# Patient Record
Sex: Female | Born: 1965 | Race: Black or African American | Hispanic: No | Marital: Single | Smoking: Former smoker
Health system: Southern US, Community
[De-identification: ages and names within clinical notes are randomized; demographics above are authoritative.]

## PROBLEM LIST (undated history)

## (undated) DIAGNOSIS — F209 Schizophrenia, unspecified: Secondary | ICD-10-CM

## (undated) DIAGNOSIS — B20 Human immunodeficiency virus [HIV] disease: Secondary | ICD-10-CM

## (undated) DIAGNOSIS — Z21 Asymptomatic human immunodeficiency virus [HIV] infection status: Secondary | ICD-10-CM

## (undated) DIAGNOSIS — F411 Generalized anxiety disorder: Secondary | ICD-10-CM

## (undated) DIAGNOSIS — M109 Gout, unspecified: Secondary | ICD-10-CM

## (undated) DIAGNOSIS — K219 Gastro-esophageal reflux disease without esophagitis: Secondary | ICD-10-CM

## (undated) DIAGNOSIS — D509 Iron deficiency anemia, unspecified: Secondary | ICD-10-CM

## (undated) HISTORY — DX: Gastro-esophageal reflux disease without esophagitis: K21.9

## (undated) HISTORY — PX: LUNG SURGERY: SHX703

## (undated) HISTORY — DX: Asymptomatic human immunodeficiency virus (hiv) infection status: Z21

## (undated) HISTORY — DX: Schizophrenia, unspecified: F20.9

## (undated) HISTORY — DX: Human immunodeficiency virus (HIV) disease: B20

## (undated) HISTORY — DX: Gout, unspecified: M10.9

## (undated) HISTORY — DX: Generalized anxiety disorder: F41.1

## (undated) HISTORY — DX: Iron deficiency anemia, unspecified: D50.9

---

## 2013-02-21 HISTORY — PX: OTHER SURGICAL HISTORY: SHX169

## 2020-03-19 ENCOUNTER — Other Ambulatory Visit (HOSPITAL_COMMUNITY): Payer: Self-pay | Admitting: *Deleted

## 2020-03-19 ENCOUNTER — Telehealth (INDEPENDENT_AMBULATORY_CARE_PROVIDER_SITE_OTHER): Payer: No Payment, Other | Admitting: Psychiatry

## 2020-03-19 ENCOUNTER — Telehealth (HOSPITAL_COMMUNITY): Payer: Self-pay | Admitting: *Deleted

## 2020-03-19 ENCOUNTER — Encounter (HOSPITAL_COMMUNITY): Payer: Self-pay | Admitting: Psychiatry

## 2020-03-19 ENCOUNTER — Other Ambulatory Visit: Payer: Self-pay

## 2020-03-19 DIAGNOSIS — F419 Anxiety disorder, unspecified: Secondary | ICD-10-CM | POA: Diagnosis not present

## 2020-03-19 DIAGNOSIS — F200 Paranoid schizophrenia: Secondary | ICD-10-CM | POA: Diagnosis not present

## 2020-03-19 MED ORDER — DOXEPIN HCL 50 MG PO CAPS: 50.0000 mg | ORAL_CAPSULE | Freq: Every day | ORAL | 1 refills | Status: DC

## 2020-03-19 MED ORDER — CLONAZEPAM 0.5 MG PO TABS: 0.5000 mg | ORAL_TABLET | Freq: Every day | ORAL | 1 refills | Status: DC

## 2020-03-19 MED ORDER — RISPERIDONE 1 MG PO TABS: 1.0000 mg | ORAL_TABLET | Freq: Every day | ORAL | 1 refills | Status: DC

## 2020-03-19 MED FILL — DOXEPIN HCL 50 MG CAPS: 50 | 30 days supply | Qty: 30 | Fill #0

## 2020-03-19 MED FILL — risperiDONE 1 MG TABS: 1 | 30 days supply | Qty: 30 | Fill #0

## 2020-03-19 NOTE — Telephone Encounter (Signed)
Call from patients case manager stating medicines have to be moved to [facility] from [place] because she hasnt had her medicaid moved to [place] yet and [place] cant do PAP for her. Called Mitch back to inform Klonopin cant be filled at community they dont do controlled drugs and It isnt a pap med and to call them and ask them to move it from [place]. He said he did call them but they referred him back to this clinic. Agreed to move her two rx to community and he will have to work out how to afford the Klonopin at [place].

## 2020-03-19 NOTE — Progress Notes (Signed)
Psychiatric Initial Adult Assessment   Virtual Visit via Video Note  I connected with Anne York on 03/19/20 at  1:00 PM EST by a video enabled telemedicine application and verified that I am speaking with the correct person using two identifiers.  Location: Patient: Home Provider: Clinic   I discussed the limitations of evaluation and management by telemedicine and the availability of in person appointments. The patient expressed understanding and agreed to proceed.  I provided 32 minutes of non-face-to-face time during this encounter.     Patient Identification: Anne York MRN:  [id number] Date of Evaluation:  03/19/2020   Referral Source: Case Worker  Chief Complaint:   " I have schizophrenia, anxiety and depression."  Visit Diagnosis:    ICD-10-CM   1. Schizophrenia, paranoid (HCC)  F20.0   2. Anxiety  F41.9     History of Present Illness: This is a 55 year old female with history of intracranial, anxiety, depression, HIV positive status who recently moved from [place] now seen for evaluation. Patient stated that she moved here from [place] in December 2021.  She informed that she moved here because her son lives in [place] and when he had come down to visit her he was very concerned about her and advised her to move up to [place] with him. She reported that when she made the decision to move here somebody had given her the contact information for a caseworker Mr. Marthann Schiller.  She stated that Mr. Marthann Schiller is the one who helped her make this appointment with the writer today. She stated that she has been taking Seroquel 300 mg at bedtime, trazodone 100 mg at bedtime, clonazepam for past many years.  She stated that she thinks she has been on the same regimen since around 2010. She stated that lately she has been dealing with a lot of auditory visual hallucinations, poor sleep and marked anxiety.  She stated that she stays at home and does not go anywhere.  She has no  interaction with anyone.  She informed that she contracted Covid in March 2021 and since then has not been able to regain her senses of smell and taste and as result her appetite is very poor.  She stated that initially her medicine Seroquel used to help her with her hallucinations and her sleep however as time has progressed she does not think the Seroquel helps her with her voices or sleep or mood anymore.  She also stated that trazodone does not help her either and she may take both the Seroquel and trazodone together at bedtime but still struggles with falling asleep and staying asleep. She stated that many years ago she used to take Seroquel 25 mg 3 times daily in the daytime along with the bedtime dose and that used to work better for her.  She also stated that she used to take clonazepam 0.5 mg 3 times a day and that helped her immensely.  However once the pandemic started she had a very hard time getting appointments with her psychiatrist.  She stated that in 2020 once the pandemic started it seems like everything around her fell apart and all her doctors appointments became very difficult to get. She stated that she eventually was able to talk to her primary care provider and they were agreeable to continue filling her prescriptions for Seroquel 300 mg at bedtime and trazodone 100 mg at bedtime along with clonazepam 0.5 mg.  She kept insisting that she was taking clonazepam 0.5 mg 3  times a day and that is what she really needs to be back on. Patient spent a considerable amount of time discussing about being prescribed a higher dose of clonazepam.  Writer checked the PDMP Rx aware of [place] state and noticed that she has been on clonazepam 0.5 mg daily for the last 2 years prescribed by a provider in [place].  Writer explained to the patient clearly that she will not be prescribed clonazepam 0.5 mg 3 times a day and all the writer can prescribe for her is clonazepam 0.5 mg daily as she was being  prescribed by her last provider in [place].  Patient agreed to that. She then stated that she does not think the Seroquel and trazodone are effective anymore and she asked if she could be switched to something else. Writer asked if she has been on any other psychotropic medications in the past, she replied she does not think she has.  She also stated that she had inconsistent psychiatry providers and she really hopes that she can continue seeing the same psychiatry provider in the future. She also Clinical research associate if Retail banker will be the one seeing her again next time.  Writer provided her with reassurance regarding that and recommended several different antipsychotics that can help with hallucinations.  Writer informed her about the option of trial of risperidone or Abilify.  Patient stated that she has heard of risperidone.  Writer advised trial of risperidone to see if that would help her with the hallucinations, paranoid delusions and also with poor sleep.  Writer also offered her doxepin to help with poor sleep as needed.  Writer asked if she needs a referral for the infectious disease clinic, patient stated that she does not think she does because her caseworker Mr. Clovis Riley has already supposed to be working on that.  She still has her HIV medication left and she is hoping to see someone in the near future. Writer double checked the EMR and could not see any other appointment scheduled for the patient.   Past Psychiatric History: Schizophrenia, anxiety, depression  Previous Psychotropic Medications: Yes   Substance Abuse History in the last 12 months:  No.  Consequences of Substance Abuse: NA  Past Medical History: HIV Infection  Family Psychiatric History: denied  Family History: No family history on file.  Social History:   Social History   Socioeconomic History  . Marital status: Unknown    Spouse name: Not on file  . Number of children: Not on file  . Years of education: Not on  file  . Highest education level: Not on file  Occupational History  . Not on file  Tobacco Use  . Smoking status: Not on file  . Smokeless tobacco: Not on file  Substance and Sexual Activity  . Alcohol use: Not on file  . Drug use: Not on file  . Sexual activity: Not on file  Other Topics Concern  . Not on file  Social History Narrative  . Not on file   Social Determinants of Health   Financial Resource Strain: Not on file  Food Insecurity: Not on file  Transportation Needs: Not on file  Physical Activity: Not on file  Stress: Not on file  Social Connections: Not on file    Additional Social History: Lives by herself, unemployed. Used to receive disability in [place]. Son lives in [place]  Allergies:  Not on File  Metabolic Disorder Labs: No results found for: HGBA1C, MPG No results found for: PROLACTIN  No results found for: CHOL, TRIG, HDL, CHOLHDL, VLDL, LDLCALC No results found for: TSH  Therapeutic Level Labs: No results found for: LITHIUM No results found for: CBMZ No results found for: VALPROATE  Current Medications: No current outpatient medications on file.   No current facility-administered medications for this visit.      Psychiatric Specialty Exam: Review of Systems  There were no vitals taken for this visit.There is no height or weight on file to calculate BMI.  General Appearance: Fairly Groomed  Eye Contact:  Good  Speech:  Clear and Coherent and Normal Rate  Volume:  Normal  Mood:  Depressed  Affect:  Tearful  Thought Process:  Goal Directed and Descriptions of Associations: Intact  Orientation:  Full (Time, Place, and Person)  Thought Content:  Logical and Hallucinations: Auditory  Suicidal Thoughts:  No  Homicidal Thoughts:  No  Memory:  Immediate;   Good Recent;   Fair  Judgement:  Fair  Insight:  Fair  Psychomotor Activity:  Normal  Concentration:  Concentration: Good and Attention Span: Good  Recall:  Good  Fund of Knowledge:Good   Language: Good  Akathisia:  Negative  Handed:  Right  AIMS (if indicated):  not done due to telemed visit  Assets:  Communication Skills Desire for Improvement Housing  ADL's:  Intact  Cognition: WNL  Sleep:  Poor     Assessment and Plan: Patient patient's history of psychiatric illness and her poor control of symptoms with her current regimen of Seroquel, trazodone will switch to different medications as per patient's preference.  Risperidone was recommended to address the hallucinations or paranoid delusions.  The group it was recommended to help with sleep.  Patient will was very insistent on being prescribed clonazepam 0.5 mg 3 times a day however after writer checked the PDMP and noted that she was only being prescribed 0.5 mg daily by her previous provider for the last 2 years writer informed the patient that she will not be given a higher dose than that.  Patient somewhat reluctantly agreed. Potential side effects of medication and risks vs benefits of treatment vs non-treatment were explained and discussed. All questions were answered. Patient is HIV positive and when the writer offered to refer her to the infectious disease clinic patient stated that she does not think she needs the referral because her caseworker is supposed to be working on that.  Writer checked the EMR and did not find any other appointment scheduled for the patient by any other specialty.    1. Schizophrenia, paranoid (HCC)  -Start risperiDONE (RISPERDAL) 1 MG tablet; Take 1 tablet (1 mg total) by mouth at bedtime.  Dispense: 30 tablet; Refill: 1 -Start doxepin (SINEQUAN) 50 MG capsule; Take 1 capsule (50 mg total) by mouth at bedtime.  Dispense: 30 capsule; Refill: 1 -Patient was instructed to discontinue Seroquel and trazodone when she gets these prescriptions filled.  2. Anxiety  - risperiDONE (RISPERDAL) 1 MG tablet; Take 1 tablet (1 mg total) by mouth at bedtime.  Dispense: 30 tablet; Refill:  1 -Continue clonazePAM (KLONOPIN) 0.5 MG tablet; Take 1 tablet (0.5 mg total) by mouth daily.  Dispense: 30 tablet; Refill: 1    Zena Amos, MD 1/27/20221:33 PM

## 2020-03-20 ENCOUNTER — Telehealth: Payer: Self-pay

## 2020-03-20 NOTE — Telephone Encounter (Signed)
[facility] Patient Product/process development scientist completed.    The patient is uninsured and will need patient assistance for medication.  We can complete the application and will need to meet with the patient for signatures and income documentation.   We gave patient a 1 month supply of Symtuza (sample) until her medicaid is active.  Clearance Coots, CPhT Specialty Pharmacy Patient [facility] for Infectious Disease Phone: [phone] Fax:  [phone]

## 2020-03-31 ENCOUNTER — Other Ambulatory Visit: Payer: Self-pay

## 2020-03-31 ENCOUNTER — Ambulatory Visit (INDEPENDENT_AMBULATORY_CARE_PROVIDER_SITE_OTHER): Payer: Medicaid Other | Admitting: Infectious Diseases

## 2020-03-31 ENCOUNTER — Encounter: Payer: Self-pay | Admitting: Infectious Diseases

## 2020-03-31 DIAGNOSIS — R0602 Shortness of breath: Secondary | ICD-10-CM

## 2020-03-31 DIAGNOSIS — R21 Rash and other nonspecific skin eruption: Secondary | ICD-10-CM

## 2020-03-31 DIAGNOSIS — B20 Human immunodeficiency virus [HIV] disease: Secondary | ICD-10-CM | POA: Insufficient documentation

## 2020-03-31 DIAGNOSIS — Z21 Asymptomatic human immunodeficiency virus [HIV] infection status: Secondary | ICD-10-CM | POA: Insufficient documentation

## 2020-03-31 NOTE — Patient Instructions (Addendum)
Please continue the Symtuza once a day.   I would like to get a chest x ray on your and lung function tests when we have access to that with your Medicaid.   Please stop by the lab on your way out.   Compression Stockings may be helpful - they sell these on [employer] and have some cute patterns!   Plan to come back in 6 weeks.    For primary care services, please call one of the following clinics for a new patient appointment:   Each clinic have different programs to support your health care needs when you don't have access to health insurance.   1. Internal Medicine Clinic - ground floor of [facility]. [phone]  2. [facility] - [address].  [phone]  3. Patient Care Center  - Near [facility] in [place]  [phone]

## 2020-03-31 NOTE — Progress Notes (Signed)
Name: Anne York  DOB: 06/30/1965 MRN: [id number] PCP: Patient, No Pcp Per     Brief Narrative:  Anne York is a 55 y.o. female with HIV dx 2005 in [place] at [facility].  CD4 nadir < 200  HIV Risk: heterosexual  History of OIs: none Intake Labs 2022: Hep B sAg (-), sAb (+), cAb (-); Hep A (+), Hep C (-) Quantiferon (-) HLA B*5701 (-) G6PD: ()   Previous Regimens: . 2013 - Prezista + Norvir + Truvada  . Symtuza   Genotypes: . 2014 - K103N noted   Subjective:   Chief Complaint  Patient presents with  . Follow-up     HPI: Anne York is here to transfer care for HIV disease.  LOV with [facility] in [place] 01/220. She is taking Symtuza once daily and last CD4 534 with undetectable viral load. Normal liver and kidney function. Pap smear done 11-2019 with negative HPV and normal cytology.   Had COVID recently and since that time she has had a hard time with breathing since then. Uses ProAir MDI recently. Had [place] in March in 2021.  Has felt like she really struggled with her breathing since.   She has a purple/flaking rash on  The bottoms of her feet hurt her and noticed Cathell bumps on the feet. She has tried steroids in the past. Saw a dermatologist but has not had a biopsy. Feels that is started on her right foot then left now it is on her lower back. Cannot quite say what is the newest lesion but it has been ongoing since May 2021. Does have associated swelling of the feet.    Review of Systems  All other systems reviewed and are negative.    Past Medical History:  Diagnosis Date  . GAD (generalized anxiety disorder)   . GERD (gastroesophageal reflux disease)   . Gout   . HIV (human immunodeficiency virus infection) ([place]) Dx 2005  . IDA (iron deficiency anemia)   . Schizophrenia [facility])     Outpatient Medications Prior to Visit  Medication Sig Dispense Refill  . albuterol (VENTOLIN HFA) 108 (90 Base) MCG/ACT inhaler Inhale 1 puff into the lungs  every 4 (four) hours as needed for wheezing or shortness of breath.    . allopurinol (ZYLOPRIM) 100 MG tablet Take 100 mg by mouth daily.    . clonazePAM (KLONOPIN) 0.5 MG tablet Take 1 tablet (0.5 mg total) by mouth daily. 30 tablet 1  . Darunavir-Cobicisctat-Emtricitabine-Tenofovir Alafenamide (SYMTUZA) 800-150-200-10 MG TABS Take 1 tablet by mouth daily with breakfast.    . doxepin (SINEQUAN) 50 MG capsule Take 1 capsule (50 mg total) by mouth at bedtime. 30 capsule 1  . QUEtiapine (SEROQUEL) 300 MG tablet Take 300 mg by mouth at bedtime.    . risperiDONE (RISPERDAL) 1 MG tablet Take 1 tablet (1 mg total) by mouth at bedtime. 30 tablet 1   No facility-administered medications prior to visit.     No Known Allergies  Social History   Tobacco Use  . Smoking status: Light Tobacco Smoker    Types: Cigarettes  . Smokeless tobacco: Never Used  Substance Use Topics  . Alcohol use: Never  . Drug use: Yes    Types: Marijuana    Family History  Problem Relation Age of Onset  . Diabetes Mother   . Hypertension Mother   . Diabetes Brother   . Kidney failure Other     Social History   Substance and Sexual Activity  Sexual Activity Not Currently     Objective:   Vitals:   03/31/20 1506  BP: (!) 89/62  Pulse: (!) 109  Weight: 174 lb (78.9 kg)   There is no height or weight on file to calculate BMI.  Physical Exam Vitals reviewed.  Constitutional:      Appearance: She is well-developed.     Comments: Seated comfortably in chair.   HENT:     Mouth/Throat:     Mouth: No oral lesions.     Dentition: Normal dentition. No dental abscesses.     Pharynx: No oropharyngeal exudate.  Cardiovascular:     Rate and Rhythm: Normal rate and regular rhythm.     Heart sounds: Normal heart sounds.  Pulmonary:     Effort: Pulmonary effort is normal.     Breath sounds: Normal breath sounds.  Abdominal:     General: There is no distension.     Palpations: Abdomen is soft.      Tenderness: There is no abdominal tenderness.  Musculoskeletal:     Comments: Gait is limping with ankle/foot pain  Lymphadenopathy:     Cervical: No cervical adenopathy.  Skin:    General: Skin is warm and dry.     Findings: No rash.     Comments: Flaking purple rash to her feet and up to lower back. Non-raised, non erythematous.   Neurological:     Mental Status: She is alert and oriented to person, place, and time.  Psychiatric:        Judgment: Judgment normal.     Comments: In good spirits today and engaged in care discussion     Lab Results Lab Results  Component Value Date   WBC 4.0 03/31/2020   HGB 12.8 03/31/2020   HCT 37.9 03/31/2020   MCV 98.4 03/31/2020   PLT 199 03/31/2020    Lab Results  Component Value Date   CREATININE 0.84 03/31/2020   BUN 25 03/31/2020   NA 140 03/31/2020   K 4.4 03/31/2020   CL 109 03/31/2020   CO2 23 03/31/2020    Lab Results  Component Value Date   ALT 15 03/31/2020   AST 26 03/31/2020   BILITOT 0.3 03/31/2020    No results found for: CHOL, HDL, LDLCALC, LDLDIRECT, TRIG, CHOLHDL HIV 1 RNA Quant (Copies/mL)  Date Value  03/31/2020 <20   CD4 T Cell Abs (/uL)  Date Value  03/31/2020 457     Assessment & Plan:   Problem List Items Addressed This Visit      Unprioritized   Rash and nonspecific skin eruption    Chronic problem that has not been diagnosed. Given rash, pulmonary symptoms and arthralgias I worry she may have sarcoid.   Discussed referral to dermatology - she would like to wait until her medicaid is on for access.       HIV (human immunodeficiency virus infection) ([place])    New patient here to establish for HIV care.  On symtyza once daily, well controlled with VL < 20 and CD4 457.   I discussed with Kortney Potvin treatment options/side effects, benefits of treatment and long-term outcomes. I discussed how HIV is transmitted and the process of untreated HIV including increased risk for opportunistic  infections, cancer, dementia and renal failure. Patient was counseled on routine HIV care including medication adherence, blood monitoring, necessary vaccines and follow up visits. Counseled regarding safe sex practices including: condom use, partner disclosure, limiting partners.  She is awaiting medicaid to be  turned on - will have her back in 1 month to help arrange primary care and specialty care referrals.   General introduction to our clinic and integrated services.  Dental referral placed today for [facility]. Information to schedule appointment completed today.   I spent greater than 45 minutes with the patient today. Greater than 50% of the time spent face-to-face counseling and coordination of care re: HIV and health maintenance.        Relevant Orders   HIV-1 RNA quant-no reflex-bld (Completed)   T-helper cell (CD4)- ([facility] clinic only) (Completed)   CBC with Differential/Platelet (Completed)   COMPLETE METABOLIC PANEL WITH GFR (Completed)   Chronic shortness of breath    Refill albuterol inhalers. She is using very frequently.          Janene Madeira, MSN, NP-C [facility] for Infectious [place] Pager: [phone] Office: [phone]  05/08/20  3:29 PM

## 2020-04-01 LAB — T-HELPER CELL (CD4) - (RCID CLINIC ONLY)
CD4 % Helper T Cell: 36 % (ref 33–65)
CD4 T Cell Abs: 457 /uL (ref 400–1790)

## 2020-04-03 LAB — COMPLETE METABOLIC PANEL WITH GFR
AG Ratio: 1.5 (calc) (ref 1.0–2.5)
ALT: 15 U/L (ref 6–29)
AST: 26 U/L (ref 10–35)
Albumin: 3.5 g/dL — ABNORMAL LOW (ref 3.6–5.1)
Alkaline phosphatase (APISO): 83 U/L (ref 37–153)
BUN: 25 mg/dL (ref 7–25)
CO2: 23 mmol/L (ref 20–32)
Calcium: 8.9 mg/dL (ref 8.6–10.4)
Chloride: 109 mmol/L (ref 98–110)
Creat: 0.84 mg/dL (ref 0.50–1.05)
GFR, Est African American: 92 mL/min/{1.73_m2} (ref >=60)
GFR, Est Non African American: 79 mL/min/{1.73_m2} (ref >=60)
Globulin: 2.3 g/dL (calc) (ref 1.9–3.7)
Glucose, Bld: 74 mg/dL (ref 65–99)
Potassium: 4.4 mmol/L (ref 3.5–5.3)
Sodium: 140 mmol/L (ref 135–146)
Total Bilirubin: 0.3 mg/dL (ref 0.2–1.2)
Total Protein: 5.8 g/dL — ABNORMAL LOW (ref 6.1–8.1)

## 2020-04-03 LAB — CBC WITH DIFFERENTIAL/PLATELET
Absolute Monocytes: 224 cells/uL (ref 200–950)
Basophils Absolute: 32 cells/uL (ref 0–200)
Basophils Relative: 0.8 %
Eosinophils Absolute: 72 cells/uL (ref 15–500)
Eosinophils Relative: 1.8 %
HCT: 37.9 % (ref 35.0–45.0)
Hemoglobin: 12.8 g/dL (ref 11.7–15.5)
Lymphs Abs: 1388 cells/uL (ref 850–3900)
MCH: 33.2 pg — ABNORMAL HIGH (ref 27.0–33.0)
MCHC: 33.8 g/dL (ref 32.0–36.0)
MCV: 98.4 fL (ref 80.0–100.0)
MPV: 10.6 fL (ref 7.5–12.5)
Monocytes Relative: 5.6 %
Neutro Abs: 2284 cells/uL (ref 1500–7800)
Neutrophils Relative %: 57.1 %
Platelets: 199 10*3/uL (ref 140–400)
RBC: 3.85 10*6/uL (ref 3.80–5.10)
RDW: 14.7 % (ref 11.0–15.0)
Total Lymphocyte: 34.7 %
WBC: 4 10*3/uL (ref 3.8–10.8)

## 2020-04-03 LAB — HIV-1 RNA QUANT-NO REFLEX-BLD
HIV 1 RNA Quant: <20 Copies/mL
HIV-1 RNA Quant, Log: <1.3 Log cps/mL

## 2020-04-06 ENCOUNTER — Telehealth: Payer: Self-pay

## 2020-04-06 NOTE — Telephone Encounter (Signed)
Yup I am - sorry my note for her is not done, that was quite the visit.   I kind of wonder if she has sarcoidosis and that bug bite has nothing to do with anything. Or post COVID related pulmonary issues.  Waiting for Medicaid to turn on so I can proceed with work up and referrals for her.

## 2020-04-06 NOTE — Telephone Encounter (Signed)
RN spoke with patient to relay per Rexene Alberts, NP that her viral load is undetectable.   Patient states she is still waiting on medicaid approval and can barely walk. She states she was bitten by something back in June on her leg and is still experiencing swelling and discoloration. She is not eating well due to losing her taste/smell from COVID and still having problems with shortness of breath. She's sleeping most of the time and forgetting to take her HIV medication and mental health medicine.   Patient was happy to hear she is still undetectable and appreciative of the call.   Sandie Ano, RN

## 2020-04-06 NOTE — Telephone Encounter (Signed)
-----   Message from Blanchard Kelch, NP sent at 04/06/2020  3:05 PM EST ----- Please try to give Paxton a call to let her know that her viral load is undetectable still.  Thank you.

## 2020-04-21 ENCOUNTER — Other Ambulatory Visit: Payer: Self-pay

## 2020-04-21 DIAGNOSIS — B20 Human immunodeficiency virus [HIV] disease: Secondary | ICD-10-CM

## 2020-04-21 MED ORDER — SYMTUZA 800-150-200-10 MG PO TABS: 1.0000 | ORAL_TABLET | Freq: Every day | ORAL | 0 refills | Status: DC

## 2020-04-21 MED FILL — SYMTUZA 800-150-200-10 MG T: 800-150-200 | 30 days supply | Qty: 30 | Fill #0

## 2020-04-22 MED FILL — DOXEPIN HCL 50 MG CAPS: 50 | 30 days supply | Qty: 30 | Fill #1

## 2020-04-22 MED FILL — risperiDONE 1 MG TABS: 1 | 30 days supply | Qty: 30 | Fill #1

## 2020-04-24 ENCOUNTER — Encounter: Payer: Self-pay | Admitting: Infectious Diseases

## 2020-04-28 ENCOUNTER — Other Ambulatory Visit: Payer: Self-pay

## 2020-04-28 ENCOUNTER — Encounter (HOSPITAL_BASED_OUTPATIENT_CLINIC_OR_DEPARTMENT_OTHER): Payer: Self-pay

## 2020-04-28 ENCOUNTER — Emergency Department (HOSPITAL_BASED_OUTPATIENT_CLINIC_OR_DEPARTMENT_OTHER)
Admission: EM | Admit: 2020-04-28 | Discharge: 2020-04-28 | Disposition: A | Discharge status: Home / Self Care | Payer: Medicaid Other | Attending: Emergency Medicine | Admitting: Emergency Medicine

## 2020-04-28 DIAGNOSIS — H9201 Otalgia, right ear: Secondary | ICD-10-CM | POA: Diagnosis present

## 2020-04-28 DIAGNOSIS — Z21 Asymptomatic human immunodeficiency virus [HIV] infection status: Secondary | ICD-10-CM | POA: Diagnosis not present

## 2020-04-28 DIAGNOSIS — F17210 Nicotine dependence, cigarettes, uncomplicated: Secondary | ICD-10-CM | POA: Diagnosis not present

## 2020-04-28 DIAGNOSIS — Z79899 Other long term (current) drug therapy: Secondary | ICD-10-CM | POA: Diagnosis not present

## 2020-04-28 DIAGNOSIS — H6691 Otitis media, unspecified, right ear: Secondary | ICD-10-CM | POA: Diagnosis not present

## 2020-04-28 MED ORDER — AMOXICILLIN-POT CLAVULANATE 875-125 MG PO TABS: 1.0000 | ORAL_TABLET | Freq: Two times a day (BID) | ORAL | 0 refills | Status: DC

## 2020-04-28 NOTE — ED Provider Notes (Signed)
[facility] EMERGENCY DEPARTMENT Provider Note   CSN: [id number] Arrival date & time: 04/28/20  0909     History Chief Complaint  Patient presents with  . Ear Pain    Anne York is a 55 y.o. female.  Anne York is a 55 y.o. female with a history of schizophrenia, HIV, iron deficiency anemia, GERD, gout, anxiety, who presents to the ED for evaluation of right ear pain.  She reports this pain started yesterday.  She reports it is a sharp throbbing ache that has been constant.  She reports it feels very similar to prior ear infections, she last had an infection about 4 to 5 years ago.  She denies any drainage from the ear, no change in hearing.  No pain to the left ear.  No nasal congestion or rhinorrhea.  No cough or sore throat.  No fevers or chills.  No headache or dental pain.  She has not taken any medications to treat her symptoms prior to arrival.  No other aggravating or alleviating factors.        Past Medical History:  Diagnosis Date  . GAD (generalized anxiety disorder)   . GERD (gastroesophageal reflux disease)   . Gout   . HIV (human immunodeficiency virus infection) (HCC) Dx 2005  . IDA (iron deficiency anemia)   . Schizophrenia [facility])     Patient Active Problem List   Diagnosis Date Noted  . HIV (human immunodeficiency virus infection) (HCC) 03/31/2020  . Schizophrenia, paranoid (HCC) 03/19/2020  . Anxiety 03/19/2020    Past Surgical History:  Procedure Laterality Date  . CESAREAN SECTION     1994, 1996  . Correction of hallux valgus by double osteotomy, bilateral  2015  . 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     Family History  Problem Relation Age of Onset  . Diabetes Mother   . Hypertension Mother   . Diabetes Brother   . Kidney failure Other     Social History   Tobacco Use  . Smoking status: Light Tobacco Smoker    Types: Cigarettes  . Smokeless tobacco: Never Used   Substance Use Topics  . Alcohol use: Never  . Drug use: Yes    Types: Marijuana    Home Medications Prior to Admission medications   Medication Sig Start Date End Date Taking? Authorizing Provider  amoxicillin-clavulanate (AUGMENTIN) 875-125 MG tablet Take 1 tablet by mouth 2 (two) times daily. One po bid x 7 days 04/28/20  Yes Dartha Lodge, PA-C  albuterol (VENTOLIN HFA) 108 (90 Base) MCG/ACT inhaler Inhale 1 puff into the lungs every 4 (four) hours as needed for wheezing or shortness of breath.    [provider]  allopurinol (ZYLOPRIM) 100 MG tablet Take 100 mg by mouth daily.    [provider]  clonazePAM (KLONOPIN) 0.5 MG tablet Take 1 tablet (0.5 mg total) by mouth daily. 03/19/20   Zena Amos, MD  Darunavir-Cobicisctat-Emtricitabine-Tenofovir Alafenamide [facility]) 800-150-200-10 MG TABS Take 1 tablet by mouth daily with breakfast. 04/21/20   Blanchard Kelch, NP  doxepin (SINEQUAN) 50 MG capsule Take 1 capsule (50 mg total) by mouth at bedtime. 03/19/20   Zena Amos, MD  QUEtiapine (SEROQUEL) 300 MG tablet Take 300 mg by mouth at bedtime.    [provider]  risperiDONE (RISPERDAL) 1 MG tablet Take 1 tablet (1 mg total) by mouth at  bedtime. 03/19/20   Zena Amos, MD    Allergies    Patient has no known allergies.  Review of Systems   Review of Systems  Constitutional: Negative for chills and fever.  HENT: Positive for ear pain. Negative for congestion, dental problem, ear discharge, facial swelling, hearing loss, rhinorrhea and sore throat.   Respiratory: Negative for cough.   Skin: Negative for color change and rash.  Neurological: Negative for headaches.  All other systems reviewed and are negative.   Physical Exam Updated Vital Signs BP 107/78 (BP Location: Right Arm)   Pulse 98   Temp 97.6 F (36.4 C) (Oral)   Resp 16   Ht 5\' 4"  (1.626 m)   Wt 79.4 kg   SpO2 100%   BMI 30.04 kg/m   Physical Exam Vitals and nursing note  reviewed.  Constitutional:      General: She is not in acute distress.    Appearance: Normal appearance. She is well-developed and well-nourished. She is not ill-appearing or diaphoretic.  HENT:     Head: Normocephalic and atraumatic.     Left Ear: Tympanic membrane and ear canal normal.     Ears:     Comments: Right TM erythematous with some bulging, canal clear, no pre or postauricular lymphadenopathy or mastoid tenderness, left ear unremarkable.    Nose: No congestion or rhinorrhea.     Mouth/Throat:     Mouth: Mucous membranes are moist.     Pharynx: Oropharynx is clear. No oropharyngeal exudate or posterior oropharyngeal erythema.  Eyes:     General:        Right eye: No discharge.        Left eye: No discharge.  Cardiovascular:     Rate and Rhythm: Normal rate.  Pulmonary:     Effort: Pulmonary effort is normal. No respiratory distress.     Breath sounds: Normal breath sounds.  Musculoskeletal:        General: No deformity.  Skin:    General: Skin is warm and dry.  Neurological:     Mental Status: She is alert.     Coordination: Coordination normal.  Psychiatric:        Mood and Affect: Mood and affect and mood normal.        Behavior: Behavior normal.     ED Results / Procedures / Treatments   Labs (all labs ordered are listed, but only abnormal results are displayed) Labs Reviewed - No data to display  EKG None  Radiology No results found.  Procedures Procedures   Medications Ordered in ED Medications - No data to display  ED Course  I have reviewed the triage vital signs and the nursing notes.  Pertinent labs & imaging results that were available during my care of the patient were reviewed by me and considered in my medical decision making (see chart for details).    MDM Rules/Calculators/A&P                         Patient presents with otalgia and exam consistent with acute otitis media. No concern for acute mastoiditis, meningitis.  Patient  discharged home with Augmentin.  Advised patient to follow-up with PCP.  I have also discussed reasons to return immediately to the ER.  Parent expresses understanding and agrees with plan.  Final Clinical Impression(s) / ED Diagnoses Final diagnoses:  Right otitis media, unspecified otitis media type    Rx / DC Orders ED Discharge  Orders         Ordered    amoxicillin-clavulanate (AUGMENTIN) 875-125 MG tablet  2 times daily        04/28/20 1109           Dartha Lodge, [place] 04/28/20 1114    Linwood Dibbles, MD 04/29/20 [phone]

## 2020-04-28 NOTE — Discharge Instructions (Signed)
You have an ear infection.  Please take Augmentin as prescribed, complete entire course of antibiotics even if symptoms resolve.  You can use Tylenol as needed for pain.  You can also use over-the-counter decongestants to help relieve ear pressure.  Follow-up with primary care.  Return for new or worsening symptoms.

## 2020-04-28 NOTE — ED Triage Notes (Signed)
Pt arrives with pain to right ear since yesterday states "I think I have a ear infection".

## 2020-05-05 ENCOUNTER — Encounter (HOSPITAL_COMMUNITY): Payer: Self-pay | Admitting: Psychiatry

## 2020-05-05 ENCOUNTER — Encounter: Payer: Self-pay | Admitting: Infectious Diseases

## 2020-05-05 ENCOUNTER — Ambulatory Visit (INDEPENDENT_AMBULATORY_CARE_PROVIDER_SITE_OTHER): Payer: Medicaid Other | Admitting: Infectious Diseases

## 2020-05-05 ENCOUNTER — Telehealth (INDEPENDENT_AMBULATORY_CARE_PROVIDER_SITE_OTHER): Payer: No Payment, Other | Admitting: Psychiatry

## 2020-05-05 ENCOUNTER — Other Ambulatory Visit: Payer: Self-pay

## 2020-05-05 ENCOUNTER — Ambulatory Visit
Admission: RE | Admit: 2020-05-05 | Discharge: 2020-05-05 | Disposition: A | Discharge status: Home / Self Care | Payer: Medicaid Other | Source: Ambulatory Visit | Attending: Infectious Diseases | Admitting: Infectious Diseases

## 2020-05-05 VITALS — BP 110/83 | HR 103 | Temp 97.7°F | Wt 174.0 lb

## 2020-05-05 DIAGNOSIS — F200 Paranoid schizophrenia: Secondary | ICD-10-CM

## 2020-05-05 DIAGNOSIS — R0602 Shortness of breath: Secondary | ICD-10-CM

## 2020-05-05 DIAGNOSIS — Z21 Asymptomatic human immunodeficiency virus [HIV] infection status: Secondary | ICD-10-CM

## 2020-05-05 DIAGNOSIS — F419 Anxiety disorder, unspecified: Secondary | ICD-10-CM

## 2020-05-05 MED ORDER — DOXEPIN HCL 100 MG PO CAPS: 100.0000 mg | ORAL_CAPSULE | Freq: Every day | ORAL | 1 refills | Status: DC

## 2020-05-05 MED ORDER — RISPERIDONE 2 MG PO TABS: 2.0000 mg | ORAL_TABLET | Freq: Every day | ORAL | 1 refills | Status: DC

## 2020-05-05 MED ORDER — CLONAZEPAM 0.5 MG PO TABS: 0.5000 mg | ORAL_TABLET | Freq: Two times a day (BID) | ORAL | 1 refills | Status: DC | PRN

## 2020-05-05 NOTE — Progress Notes (Signed)
Name: Anne York  DOB: 07-02-1965 MRN: [id number] PCP: Patient, No Pcp Per     Brief Narrative:  Anne York is a 55 y.o. female with HIV dx 2005 in [place] at [facility].  CD4 nadir < 200  HIV Risk: heterosexual  History of OIs: none Intake Labs 2022: Hep B sAg (-), sAb (+), cAb (-); Hep A (+), Hep C (-) Quantiferon (-) HLA B*5701 (-) G6PD: ()   Previous Regimens: . 2013 - Prezista + Norvir + Truvada  . Symtuza   Genotypes: . 2014 - K103N noted     Subjective:   Chief Complaint  Patient presents with  . Follow-up    B20     HPI: Anne York is here for follow up to coordinate primary and specialty care. She has access to [facility] medicaid now.   Feels like her breathing has gotten worse since last OV. She "keeps her pump with her 24/7". Stairs are impossible for her now. Cough that is often productive early in AM.   Has no sense of smell/taste - this impacts her ability to eat and enjoy food. Has not lost or gained weight.   R ear pain and infection on antibiotics now (augmentin) and pain has subsided. Happy this is helping her.    Review of Systems  Constitutional: Negative for chills, fever, malaise/fatigue and weight loss.  HENT: Negative for sore throat.   Respiratory: Positive for cough, sputum production and shortness of breath.   Cardiovascular: Negative for chest pain and leg swelling.  Gastrointestinal: Negative for abdominal pain, diarrhea and vomiting.  Genitourinary: Negative for dysuria and flank pain.  Musculoskeletal: Positive for joint pain (b/l ankles and feet ). Negative for myalgias and neck pain.  Skin: Positive for rash.  Neurological: Negative for dizziness, tingling and headaches.  Psychiatric/Behavioral: Negative for depression, hallucinations and substance abuse. The patient is not nervous/anxious and does not have insomnia.      Past Medical History:  Diagnosis Date  . GAD (generalized anxiety disorder)   . GERD (gastroesophageal  reflux disease)   . Gout   . HIV (human immunodeficiency virus infection) ([place]) Dx 2005  . IDA (iron deficiency anemia)   . Schizophrenia [facility])     Outpatient Medications Prior to Visit  Medication Sig Dispense Refill  . albuterol (VENTOLIN HFA) 108 (90 Base) MCG/ACT inhaler Inhale 1 puff into the lungs every 4 (four) hours as needed for wheezing or shortness of breath.    . allopurinol (ZYLOPRIM) 100 MG tablet Take 100 mg by mouth daily.    Marland Kitchen amoxicillin-clavulanate (AUGMENTIN) 875-125 MG tablet Take 1 tablet by mouth 2 (two) times daily. One po bid x 7 days 14 tablet 0  . clonazePAM (KLONOPIN) 0.5 MG tablet Take 1 tablet (0.5 mg total) by mouth 2 (two) times daily as needed for anxiety. 60 tablet 1  . Darunavir-Cobicisctat-Emtricitabine-Tenofovir Alafenamide (SYMTUZA) 800-150-200-10 MG TABS Take 1 tablet by mouth daily with breakfast. 30 tablet 0  . doxepin (SINEQUAN) 100 MG capsule Take 1 capsule (100 mg total) by mouth at bedtime. 30 capsule 1  . risperiDONE (RISPERDAL) 2 MG tablet Take 1 tablet (2 mg total) by mouth at bedtime. 30 tablet 1   No facility-administered medications prior to visit.     No Known Allergies  Social History   Tobacco Use  . Smoking status: Light Tobacco Smoker    Types: Cigarettes  . Smokeless tobacco: Never Used  Substance Use Topics  . Alcohol use: Never  .  Drug use: Yes    Types: Marijuana    Family History  Problem Relation Age of Onset  . Diabetes Mother   . Hypertension Mother   . Diabetes Brother   . Kidney failure Other     Social History   Substance and Sexual Activity  Sexual Activity Not Currently     Objective:   Vitals:   05/05/20 1525  BP: 110/83  Pulse: (!) 103  Temp: 97.7 F (36.5 C)  TempSrc: Oral  Weight: 174 lb (78.9 kg)   Body mass index is 29.87 kg/m.  Physical Exam HENT:     Mouth/Throat:     Mouth: No oral lesions.     Dentition: No dental abscesses.  Cardiovascular:     Rate and Rhythm: Normal  rate and regular rhythm.     Heart sounds: Normal heart sounds.  Pulmonary:     Effort: Pulmonary effort is normal.     Breath sounds: Rhonchi present.  Abdominal:     General: There is no distension.     Palpations: Abdomen is soft.     Tenderness: There is no abdominal tenderness.  Musculoskeletal:        General: No tenderness. Normal range of motion.  Lymphadenopathy:     Cervical: No cervical adenopathy.  Skin:    General: Skin is warm and dry.     Findings: No rash.  Neurological:     Mental Status: She is alert and oriented to person, place, and time.  Psychiatric:        Judgment: Judgment normal.     Lab Results Lab Results  Component Value Date   WBC 4.0 03/31/2020   HGB 12.8 03/31/2020   HCT 37.9 03/31/2020   MCV 98.4 03/31/2020   PLT 199 03/31/2020    Lab Results  Component Value Date   CREATININE 0.84 03/31/2020   BUN 25 03/31/2020   NA 140 03/31/2020   K 4.4 03/31/2020   CL 109 03/31/2020   CO2 23 03/31/2020    Lab Results  Component Value Date   ALT 15 03/31/2020   AST 26 03/31/2020   BILITOT 0.3 03/31/2020    No results found for: CHOL, HDL, LDLCALC, LDLDIRECT, TRIG, CHOLHDL HIV 1 RNA Quant (Copies/mL)  Date Value  03/31/2020 <20   CD4 T Cell Abs (/uL)  Date Value  03/31/2020 457     Assessment & Plan:   Problem List Items Addressed This Visit      Unprioritized   HIV (human immunodeficiency virus infection) ([place]) - Primary    We reviewed her recent lab findings and she has ongoing excellent control with Symtuza. VL < 20. CD4 456. May need to change her HIV medication if there is no other option for pulmonary symptoms aside from inhaled corticosteroids.   Will help her connect to IM clinic for preventative and chronic disease care.   Return in about 4 months (around 09/04/2020).       Relevant Orders   HIV-1 RNA quant-no reflex-bld   T-helper cell (CD4)- ([facility] clinic only)   Chronic shortness of breath    She has a history of  asthma but has noticed that since COVID-19 infection this has worsened significantly. On exam she has diffuse rhonchi. No wheezing presently (but used MDI recently). No constitutional symptoms to suggest infection (she is currently on Augmentin for otitis media).  Will send for CXR today.   Referral to pulmonology. She likely needs pulmonary function testing and maintenance inhalers. Her  Symtuza regimen will be a problem with any inhaled steroids - if possible to avoid them that would be great to avoid adrenal suppression with co-administration with cobicistat. However she would be a good candidate to change to [place] - she is very hesitant to change however.   Given her unexplained rash and ankle pain I also do worry about sarcoidosis as a potential diagnosis for her. Alternatively under-treated COPD vs COVID sequela.      Relevant Orders   Ambulatory referral to Pulmonology   DG Chest 2 View (Completed)      Janene Madeira, MSN, NP-C [facility] for [place] Pager: [phone] Office: [phone]  05/06/20  4:09 PM

## 2020-05-05 NOTE — Progress Notes (Signed)
BH OP Progress Note   Virtual Visit via Telephone Note  I connected with Maison Agrusa on 05/05/20 at  2:00 PM EDT by telephone and verified that I am speaking with the correct person using two identifiers.  Location: Patient: car (with her son) Provider: Clinic   I discussed the limitations, risks, security and privacy concerns of performing an evaluation and management service by telephone and the availability of in person appointments. I also discussed with the patient that there may be a patient responsible charge related to this service. The patient expressed understanding and agreed to proceed.   I provided 15 minutes of non-face-to-face time during this encounter.     Patient Identification: Rolinda Impson MRN:  [id number] Date of Evaluation:  05/05/2020    Chief Complaint:   " I am having a lot of anxiety.  I am on my way to see my doctor."  Visit Diagnosis:    ICD-10-CM   1. Schizophrenia, paranoid (HCC)  F20.0 doxepin (SINEQUAN) 100 MG capsule    risperiDONE (RISPERDAL) 2 MG tablet  2. Anxiety  F41.9 clonazePAM (KLONOPIN) 0.5 MG tablet    History of Present Illness: Patient contacted for follow-up today.  She informed that she is in the car with her son going to her other doctor's appointment.  As per EMR she is scheduled to be seen at [facility] at 3 today.  Patient has been following up regularly regarding her HIV condition. She stated that clonazepam in the morning helps her a little bit but she still has a lot of anxiety throughout the day.  She stated that she constantly feels anxious and is unable to relax.  She stated that ever since the Covid pandemic started she has not felt like herself. "  Covid got me rattled out." She stated that she is afraid to go out.  Sometimes she wonders if she is having a heart attack because she feels very anxious.  She stated that she does not know what to do when she is going to ask her other provider regarding this. Writer asked her if  adjusting her dose of clonazepam would help and she readily agreed for that.  She repeated that she was taking clonazepam more than once a day and that was helping her better.  Writer recommended going up on the dose to twice daily to see if that would help her anxiety. Regarding her hallucinations and paranoia, she stated that hallucinations are still present they have not worsened.  She was agreeable to going up on the dose of risperidone for optimal effect.  Regarding sleep, she stated that she is sleeping a little bit better compared to the past but the medicine is still not strong enough for her.  She was agreeable to adjusting the dose of doxepin to 100 mg at bedtime.   Past Psychiatric History: Schizophrenia, anxiety, depression  Previous Psychotropic Medications: Yes   Substance Abuse History in the last 12 months:  No.  Consequences of Substance Abuse: NA  Past Medical History: HIV Infection  Family Psychiatric History: denied  Family History:  Family History  Problem Relation Age of Onset  . Diabetes Mother   . Hypertension Mother   . Diabetes Brother   . Kidney failure Other     Social History:   Social History   Socioeconomic History  . Marital status: Single    Spouse name: Not on file  . Number of children: Not on file  . Years of education: Not on file  .  Highest education level: Not on file  Occupational History  . Not on file  Tobacco Use  . Smoking status: Light Tobacco Smoker    Types: Cigarettes  . Smokeless tobacco: Never Used  Substance and Sexual Activity  . Alcohol use: Never  . Drug use: Yes    Types: Marijuana  . Sexual activity: Not on file  Other Topics Concern  . Not on file  Social History Narrative  . Not on file   Social Determinants of Health   Financial Resource Strain: Not on file  Food Insecurity: Not on file  Transportation Needs: Not on file  Physical Activity: Not on file  Stress: Not on file  Social Connections: Not on  file    Additional Social History: Lives by herself, unemployed. Used to receive disability in [place]. Son lives in [place]  Allergies:  No Known Allergies  Metabolic Disorder Labs: No results found for: HGBA1C, MPG No results found for: PROLACTIN No results found for: CHOL, TRIG, HDL, CHOLHDL, VLDL, LDLCALC No results found for: TSH  Therapeutic Level Labs: No results found for: LITHIUM No results found for: CBMZ No results found for: VALPROATE  Current Medications: Current Outpatient Medications  Medication Sig Dispense Refill  . clonazePAM (KLONOPIN) 0.5 MG tablet Take 1 tablet (0.5 mg total) by mouth 2 (two) times daily as needed for anxiety. 60 tablet 1  . doxepin (SINEQUAN) 100 MG capsule Take 1 capsule (100 mg total) by mouth at bedtime. 30 capsule 1  . risperiDONE (RISPERDAL) 2 MG tablet Take 1 tablet (2 mg total) by mouth at bedtime. 30 tablet 1  . albuterol (VENTOLIN HFA) 108 (90 Base) MCG/ACT inhaler Inhale 1 puff into the lungs every 4 (four) hours as needed for wheezing or shortness of breath.    . allopurinol (ZYLOPRIM) 100 MG tablet Take 100 mg by mouth daily.    Marland Kitchen amoxicillin-clavulanate (AUGMENTIN) 875-125 MG tablet Take 1 tablet by mouth 2 (two) times daily. One po bid x 7 days 14 tablet 0  . Darunavir-Cobicisctat-Emtricitabine-Tenofovir Alafenamide (SYMTUZA) 800-150-200-10 MG TABS Take 1 tablet by mouth daily with breakfast. 30 tablet 0   No current facility-administered medications for this visit.      Psychiatric Specialty Exam: Review of Systems  There were no vitals taken for this visit.There is no height or weight on file to calculate BMI.  General Appearance: Unable to assess due to phone visit  Eye Contact:  Unable to assess due to phone visit  Speech:  Clear and Coherent and Normal Rate  Volume:  Normal  Mood:  Anxious  Affect:  Anxious, hyperventilating  Thought Process:  Goal Directed and Descriptions of Associations: Intact  Orientation:  Full  (Time, Place, and Person)  Thought Content:  Logical and Hallucinations: Auditory  Suicidal Thoughts:  No  Homicidal Thoughts:  No  Memory:  Immediate;   Good Recent;   Fair  Judgement:  Fair  Insight:  Fair  Psychomotor Activity:  Normal  Concentration:  Concentration: Good and Attention Span: Good  Recall:  Good  Fund of Knowledge:Good  Language: Good  Akathisia:  Negative  Handed:  Right  AIMS (if indicated):  not done due to telemed visit  Assets:  Communication Skills Desire for Improvement Housing  ADL's:  Intact  Cognition: WNL  Sleep:  Fair   [employer]   Flowsheet Row Office Visit from 03/31/2020 in [facility] for Infectious Disease  PHQ-2 Total Score 2    Flowsheet Row ED from  04/28/2020 in [facility] EM[facility]GENC[facility]PARTMENT  C-SSRS RISK CATEGORY No Risk      Assessment and Plan: Patient reported that she is still dealing with significant anxiety and sometimes wonders if she is having a heart attack due to the panic episodes.  She also reported that she still has auditory hallucinations and her sleep has only improved slightly with the addition of doxepin. She was agreeable to the recommendation of adjusting her doses.  The doses of all her medications were increased for optimal effect. Potential side effects of medication and risks vs benefits of treatment vs non-treatment were explained and discussed. All questions were answered.   1. Schizophrenia, paranoid (HCC)  -Increase doxepin (SINEQUAN) 100 MG capsule; Take 1 capsule (100 mg total) by mouth at bedtime.  Dispense: 30 capsule; Refill: 1 -Increase risperiDONE (RISPERDAL) 2 MG tablet; Take 1 tablet (2 mg total) by mouth at bedtime.  Dispense: 30 tablet; Refill: 1  2. Anxiety  -Increase clonazePAM (KLONOPIN) 0.5 MG tablet; Take 1 tablet (0.5 mg total) by mouth 2 (two) times daily as needed for anxiety.  Dispense: 60 tablet; Refill: 1  F/up in 2 months.   Zena Amos, MD 3/15/20222:05 PM

## 2020-05-05 NOTE — Patient Instructions (Addendum)
Please go across the hall to [facility] to get a chest xray today if you can.   I am referring you to pulmonology to help with your cough and shortness of breath. You need more testing to figure things out.   For primary care services, please call one of the following clinics for a new patient appointment:   Internal Medicine Clinic - ground floor of [facility]. [phone]   Please come back to see me again in 4 months with labs prior to your visit.

## 2020-05-06 DIAGNOSIS — R0602 Shortness of breath: Secondary | ICD-10-CM | POA: Insufficient documentation

## 2020-05-06 NOTE — Assessment & Plan Note (Addendum)
We reviewed her recent lab findings and she has ongoing excellent control with Symtuza. VL < 20. CD4 456. May need to change her HIV medication if there is no other option for pulmonary symptoms aside from inhaled corticosteroids.   Will help her connect to IM clinic for preventative and chronic disease care.   Return in about 4 months (around 09/04/2020).

## 2020-05-06 NOTE — Assessment & Plan Note (Addendum)
She has a history of asthma but has noticed that since COVID-19 infection this has worsened significantly. On exam she has diffuse rhonchi. No wheezing presently (but used MDI recently). No constitutional symptoms to suggest infection (she is currently on Augmentin for otitis media).  Will send for CXR today.   Referral to pulmonology. She likely needs pulmonary function testing and maintenance inhalers. Her Symtuza regimen will be a problem with any inhaled steroids - if possible to avoid them that would be great to avoid adrenal suppression with co-administration with cobicistat. However she would be a good candidate to change to [place] - she is very hesitant to change however.   Given her unexplained rash and ankle pain I also do worry about sarcoidosis as a potential diagnosis for her. Alternatively under-treated COPD vs COVID sequela.

## 2020-05-07 ENCOUNTER — Telehealth: Payer: Self-pay

## 2020-05-07 NOTE — Telephone Encounter (Signed)
I called patient to relay x ray results and to let her know she has been referred to pulmonology for her SOB Patient does not have a secured voicemail setup.  Anne York [name]

## 2020-05-07 NOTE — Telephone Encounter (Signed)
-----   Message from Blanchard Kelch, NP sent at 05/06/2020 10:03 PM EDT ----- Please give Anne York a call to let her know that she has no evidence of any pneumonia or abnormalities in the lungs seen on x-ray that would explain her shortness of breath. She needs pulmonary function testing to see how well her lungs are opening and expanding though. Pulmonology should be calling her for an appointment. If she does not hear from their office in 1 week please have her call us to ensure it is followed up on.  Thank you

## 2020-05-08 ENCOUNTER — Encounter: Payer: Self-pay | Admitting: Infectious Diseases

## 2020-05-08 DIAGNOSIS — R21 Rash and other nonspecific skin eruption: Secondary | ICD-10-CM | POA: Insufficient documentation

## 2020-05-08 NOTE — Assessment & Plan Note (Signed)
Refill albuterol inhalers. She is using very frequently.

## 2020-05-08 NOTE — Assessment & Plan Note (Signed)
New patient here to establish for HIV care.  On symtyza once daily, well controlled with VL < 20 and CD4 457.   I discussed with Anne York treatment options/side effects, benefits of treatment and long-term outcomes. I discussed how HIV is transmitted and the process of untreated HIV including increased risk for opportunistic infections, cancer, dementia and renal failure. Patient was counseled on routine HIV care including medication adherence, blood monitoring, necessary vaccines and follow up visits. Counseled regarding safe sex practices including: condom use, partner disclosure, limiting partners.  She is awaiting medicaid to be turned on - will have her back in 1 month to help arrange primary care and specialty care referrals.   General introduction to our clinic and integrated services.  Dental referral placed today for [facility]. Information to schedule appointment completed today.   I spent greater than 45 minutes with the patient today. Greater than 50% of the time spent face-to-face counseling and coordination of care re: HIV and health maintenance.

## 2020-05-08 NOTE — Assessment & Plan Note (Signed)
Chronic problem that has not been diagnosed. Given rash, pulmonary symptoms and arthralgias I worry she may have sarcoid.   Discussed referral to dermatology - she would like to wait until her medicaid is on for access.

## 2020-05-08 NOTE — Telephone Encounter (Signed)
Attempted to call patient, no answer.   Sandie Ano, RN

## 2020-05-11 NOTE — Telephone Encounter (Signed)
I called patient and relayed x ray results to her and that she has been referred to pulmonology  Patient also advised she should be getting a call from Pulmonology to get an appointment scheduled with them and if she does not hear from them by the end of this week to call our office back. Kyshaun Barnette [name]

## 2020-05-27 ENCOUNTER — Other Ambulatory Visit: Payer: Self-pay | Admitting: Infectious Diseases

## 2020-05-29 ENCOUNTER — Other Ambulatory Visit (HOSPITAL_COMMUNITY): Payer: Self-pay

## 2020-05-29 ENCOUNTER — Other Ambulatory Visit: Payer: Self-pay | Admitting: Infectious Diseases

## 2020-05-29 DIAGNOSIS — B20 Human immunodeficiency virus [HIV] disease: Secondary | ICD-10-CM

## 2020-05-29 MED ORDER — SYMTUZA 800-150-200-10 MG PO TABS
1.0000 | ORAL_TABLET | Freq: Every day | ORAL | 5 refills | Status: DC
  Filled 2020-05-29: qty 30, 30d supply, fill #0
  Filled 2020-07-06: qty 30, 30d supply, fill #1
  Filled 2020-07-28: qty 30, 30d supply, fill #2
  Filled 2020-08-27: qty 30, 30d supply, fill #3
  Filled 2020-09-23: qty 30, 30d supply, fill #4
  Filled 2020-10-21: qty 30, 30d supply, fill #5

## 2020-05-30 ENCOUNTER — Other Ambulatory Visit (HOSPITAL_COMMUNITY): Payer: Self-pay

## 2020-06-01 ENCOUNTER — Encounter: Payer: Self-pay | Admitting: Pulmonary Disease

## 2020-06-02 ENCOUNTER — Other Ambulatory Visit: Payer: Self-pay

## 2020-06-02 ENCOUNTER — Ambulatory Visit: Payer: Medicaid Other | Admitting: Internal Medicine

## 2020-06-02 ENCOUNTER — Encounter: Payer: Self-pay | Admitting: Internal Medicine

## 2020-06-02 VITALS — BP 101/65 | HR 110 | Temp 98.1°F | Ht 63.0 in | Wt 173.2 lb

## 2020-06-02 DIAGNOSIS — F200 Paranoid schizophrenia: Secondary | ICD-10-CM | POA: Diagnosis not present

## 2020-06-02 DIAGNOSIS — F419 Anxiety disorder, unspecified: Secondary | ICD-10-CM

## 2020-06-02 DIAGNOSIS — R0602 Shortness of breath: Secondary | ICD-10-CM

## 2020-06-02 DIAGNOSIS — R21 Rash and other nonspecific skin eruption: Secondary | ICD-10-CM | POA: Diagnosis not present

## 2020-06-02 NOTE — Assessment & Plan Note (Addendum)
Mentions having history of paranoid schizophrenia. Currently on risperidone and doxepine. Mentions her symptoms are not fully controlled and has lot of anxiety regarding her symptoms. Denies current suicidal/homical ideations. Denies visual/auditory/tactile hallucinations  A/P Offered referral to psych but mentions discussing this at follow up visit

## 2020-06-02 NOTE — Progress Notes (Signed)
CC: Rash  HPI: Ms.Abeni Vinsant is a 55 y.o. with PMH listed below presenting with complaint of rash. Please see problem based assessment and plan for further details.  Past Medical History:  Diagnosis Date  . GAD (generalized anxiety disorder)   . GERD (gastroesophageal reflux disease)   . Gout   . HIV (human immunodeficiency virus infection) (HCC) Dx 2005  . IDA (iron deficiency anemia)   . Schizophrenia (HCC)    Family History No early heart disease  Social History Lives alone, recently moved from [place]. Her son lives in [place]. She used to drink intermittently until 2 months ago. She smokes 2-3 cigarettes a day until 3 months ago. No drug use.   Review of Systems: Review of Systems  Constitutional: Negative for chills, fever and malaise/fatigue.  Eyes: Negative for blurred vision.  Respiratory: Negative for shortness of breath.   Cardiovascular: Negative for chest pain, palpitations and leg swelling.  Gastrointestinal: Negative for constipation, diarrhea, nausea and vomiting.  Skin: Positive for itching and rash.  Neurological: Negative for dizziness.  Psychiatric/Behavioral: Negative for hallucinations. The patient is nervous/anxious.   All other systems reviewed and are negative.    Physical Exam: Vitals:   06/02/20 1522  BP: 101/65  Pulse: (!) 110  Temp: 98.1 F (36.7 C)  TempSrc: Oral  SpO2: 97%  Weight: 173 lb 3.2 oz (78.6 kg)  Height: 5\' 3"  (1.6 m)   Gen: Well-developed, well nourished, NAD HEENT: NCAT head, hearing intact CV: RRR, S1, S2 normal Pulm: CTAB, No rales, no wheezes Extm: ROM intact, Peripheral pulses intact, No peripheral edema Skin: Dry, Warm, widespread hyperpigmented maculopapular rash on bilateral lower extremities, trunk and back pictured below     Assessment & Plan:   Chronic shortness of breath Mentions having COVID-19 infection last year. States has been feeling short of breath and having 'mental fogginess' since the  infection. Uses albuterol inhaler as needed. Has upcoming appointing with Dr.Hunsucker with pulm.  - F/u w/ pulm - C/w albuterol inhaler prn  Anxiety GAD 7 : Generalized Anxiety Score 06/02/2020  Nervous, Anxious, on Edge 3  Control/stop worrying 3  Worry too much - different things 3  Trouble relaxing 2  Restless 3  Easily annoyed or irritable 3  Afraid - awful might happen 2  Total GAD 7 Score 19  Anxiety Difficulty Extremely difficult   Gad-7 score of 19. Mentions being on Klonopin for her symptoms. PDMP confirms recent orders from ID office. Currently feeling anxious about her symptoms.  - C/w clonazepam 0.5mg  BID PRN  Schizophrenia, paranoid (HCC) Mentions having history of paranoid schizophrenia. Currently on risperidone and doxepine. Mentions her symptoms are not fully controlled and has lot of anxiety regarding her symptoms. Denies current suicidal/homical ideations. Denies visual/auditory/tactile hallucinations  A/P Offered referral to psych but mentions discussing this at follow up visit  Rash and nonspecific skin eruption Ms.Liz is a 55 yo F w/ PMH of HIV on Symtuza, Schizophrenia, hx of COVID infection presenting to [facility] with complaint of chronic rash. She mentions she had hard blisters on her foot that developed into a pruritic rash that started about a year ago in [facility] after what she assumed to be a bug bite during picnic. Since then, she has been having continued pruritus and difficulty with ambulation due to discomfort. She also mentions the rash appear to have spread from the lower extremities to the torso. She mentions that she has been treated for fungal infection and with topical  steroids with little benefit. She also mentions she used to follow with dermatology but she never had biopsy performed.  A/P Presenting with chronic rash of bilateral lower extremities and torso. Appear maculopapular in centrolobular distribution on exam but duration of rash is  questionable for infectious source. Differential includes tinea, pityriasis rosea, sarcoidosis, lichen planus.  - Offered topical steroids but mentions has supply at home - RTC for skin biopsy   Patient discussed with Dr. Criselda Peaches  -Judeth Cornfield, PGY3 [facility] Pager: [phone]

## 2020-06-02 NOTE — Assessment & Plan Note (Signed)
Mentions having COVID-19 infection last year. States has been feeling short of breath and having 'mental fogginess' since the infection. Uses albuterol inhaler as needed. Has upcoming appointing with Dr.Hunsucker with pulm.  - F/u w/ pulm - C/w albuterol inhaler prn

## 2020-06-02 NOTE — Assessment & Plan Note (Signed)
GAD 7 : Generalized Anxiety Score 06/02/2020  Nervous, Anxious, on Edge 3  Control/stop worrying 3  Worry too much - different things 3  Trouble relaxing 2  Restless 3  Easily annoyed or irritable 3  Afraid - awful might happen 2  Total GAD 7 Score 19  Anxiety Difficulty Extremely difficult   Gad-7 score of 19. Mentions being on Klonopin for her symptoms. PDMP confirms recent orders from ID office. Currently feeling anxious about her symptoms.  - C/w clonazepam 0.5mg  BID PRN

## 2020-06-02 NOTE — Patient Instructions (Signed)
Thank you for allowing [place] to provide your care today. Today we discussed your rash    I have ordered no labs for you. I will call if any are abnormal.    Today we made the following changes to your medications.    Please start Kenalog 0.1% twice daily  Please follow-up in 2 weeks.    Should you have any questions or concerns please call the internal medicine clinic at [phone].    Triamcinolone Aerosol, Cream, Lotion, Ointment What is this medicine? TRIAMCINOLONE (trye am SIN oh lone) is a corticosteroid. It is used on the skin to reduce swelling, redness, itching, and allergic reactions. This medicine may be used for other purposes; ask your health care provider or pharmacist if you have questions. COMMON BRAND NAME(S): Aristocort, Aristocort A, Aristocort HP, Cinalog, Cinolar, DERMASORB TA Complete, Flutex, Kenalog, Pediaderm TA, Sila III, SP Rx 228, Triacet, Trianex, Triderm What should I tell my health care provider before I take this medicine? They need to know if you have any of these conditions:  any active infection  large areas of burned or damaged skin  skin wasting or thinning  an unusual or allergic reaction to triamcinolone, corticosteroids, other medicines, foods, dyes, or preservatives  pregnant or trying to get pregnant  breast-feeding How should I use this medicine? This medicine is for external use only. Do not take by mouth. Follow the directions on the prescription label. Wash your hands before and after use. Apply a thin film of medicine to the affected area. Do not cover with a bandage or dressing unless your doctor or health care professional tells you to. Do not use on healthy skin or over large areas of skin. Do not get this medicine in your eyes. If you do, rinse out with plenty of cool tap water. It is important not to use more medicine than prescribed. Do not use your medicine more often than directed. Talk to your pediatrician regarding the use of  this medicine in children. Special care may be needed. Elderly patients are more likely to have damaged skin through aging, and this may increase side effects. This medicine should only be used for brief periods and infrequently in older patients. Overdosage: If you think you have taken too much of this medicine contact a poison control center or emergency room at once. NOTE: This medicine is only for you. Do not share this medicine with others. What if I miss a dose? If you miss a dose, use it as soon as you can. If it is almost time for your next dose, use only that dose. Do not use double or extra doses. What may interact with this medicine? Interactions are not expected. This list may not describe all possible interactions. Give your health care provider a list of all the medicines, herbs, non-prescription drugs, or dietary supplements you use. Also tell them if you smoke, drink alcohol, or use illegal drugs. Some items may interact with your medicine. What should I watch for while using this medicine? Tell your doctor or health care professional if your symptoms do not start to get better within one week. Do not use for more than 14 days. Do not use on healthy skin or over large areas of skin. Tell your doctor or health care professional if you are exposed to anyone with measles or chickenpox, or if you develop sores or blisters that do not heal properly. Do not use an airtight bandage to cover the affected area unless your  doctor or health care professional tells you to. If you are to cover the area, follow the instructions carefully. Covering the area where the medicine is applied can increase the amount that passes through the skin and increases the risk of side effects. If treating the diaper area of a child, avoid covering the treated area with tight-fitting diapers or plastic pants. This may increase the amount of medicine that passes through the skin and increase the risk of serious side  effects. What side effects may I notice from receiving this medicine? Side effects that you should report to your doctor or health care professional as soon as possible:  burning or itching of the skin  dark red spots on the skin  infection  painful, red, pus filled blisters in hair follicles  thinning of the skin, sunburn more likely especially on the face Side effects that usually do not require medical attention (report to your doctor or health care professional if they continue or are bothersome):  dry skin, irritation  unusual increased growth of hair on the face or body This list may not describe all possible side effects. Call your doctor for medical advice about side effects. You may report side effects to FDA at 1-800-FDA-1088. Where should I keep my medicine? Keep out of the reach of children. Store at room temperature between 15 and 30 degrees C (59 and 86 degrees F). Do not freeze. Throw away any unused medicine after the expiration date. NOTE: This sheet is a summary. It may not cover all possible information. If you have questions about this medicine, talk to your doctor, pharmacist, or health care provider.  2021 Elsevier/Gold Standard (2019-12-19 11:04:44)

## 2020-06-02 NOTE — Assessment & Plan Note (Signed)
Anne York is a 55 yo F w/ PMH of HIV on Symtuza, Schizophrenia, hx of COVID infection presenting to [facility] with complaint of chronic rash. She mentions she had hard blisters on her foot that developed into a pruritic rash that started about a year ago in [place] after what she assumed to be a bug bite during picnic. Since then, she has been having continued pruritus and difficulty with ambulation due to discomfort. She also mentions the rash appear to have spread from the lower extremities to the torso. She mentions that she has been treated for fungal infection and with topical steroids with little benefit. She also mentions she used to follow with dermatology but she never had biopsy performed.  A/P Presenting with chronic rash of bilateral lower extremities and torso. Appear maculopapular in centrolobular distribution on exam but duration of rash is questionable for infectious source. Differential includes tinea, pityriasis rosea, sarcoidosis, lichen planus.  - Offered topical steroids but mentions has supply at home - RTC for skin biopsy

## 2020-06-07 NOTE — Progress Notes (Signed)
Internal Medicine Clinic Attending  Case discussed with Dr. Lee  At the time of the visit.  We reviewed the resident's history and exam and pertinent patient test results.  I agree with the assessment, diagnosis, and plan of care documented in the resident's note.    

## 2020-06-08 ENCOUNTER — Other Ambulatory Visit (HOSPITAL_COMMUNITY): Payer: Self-pay

## 2020-06-09 ENCOUNTER — Other Ambulatory Visit (HOSPITAL_COMMUNITY): Payer: Self-pay

## 2020-06-10 ENCOUNTER — Other Ambulatory Visit (HOSPITAL_COMMUNITY): Payer: Self-pay

## 2020-06-17 ENCOUNTER — Encounter: Payer: Self-pay | Admitting: Internal Medicine

## 2020-06-17 ENCOUNTER — Other Ambulatory Visit: Payer: Self-pay

## 2020-06-17 ENCOUNTER — Ambulatory Visit: Payer: Medicaid Other | Admitting: Internal Medicine

## 2020-06-17 DIAGNOSIS — F200 Paranoid schizophrenia: Secondary | ICD-10-CM

## 2020-06-17 DIAGNOSIS — R21 Rash and other nonspecific skin eruption: Secondary | ICD-10-CM

## 2020-06-17 NOTE — Progress Notes (Signed)
   CC: Rash  HPI: Ms.Pascale Nay is a 55 y.o. with PMH listed below presenting with complaint of rash. Please see problem based assessment and plan for further details.  Past Medical History:  Diagnosis Date  . GAD (generalized anxiety disorder)   . GERD (gastroesophageal reflux disease)   . Gout   . HIV (human immunodeficiency virus infection) (HCC) Dx 2005  . IDA (iron deficiency anemia)   . Schizophrenia (HCC)    Review of Systems: Review of Systems  Musculoskeletal: Positive for back pain and joint pain.  Neurological: Negative for tingling and focal weakness.  Psychiatric/Behavioral: Negative for hallucinations. The patient is nervous/anxious.   All other systems reviewed and are negative.    Physical Exam: Vitals:   06/17/20 1530  BP: 109/74  Pulse: 94  Temp: 97.7 F (36.5 C)  TempSrc: Oral  SpO2: 100%  Weight: 177 lb 4.8 oz (80.4 kg)  Height: 5\' 3"  (1.6 m)   Gen: Well-developed, well nourished, ill-appearing HEENT: NCAT head, hearing intact Extm: ROM intact, Peripheral pulses intact, non-pitting ankle edema Skin: Dry, Warm, normal turgor, widespread hyperpigmented maculopapular rash on feet, legs, hands, trunk and back noted again w/o significant change from prior evaluation Psych: Anxious, fluctuating affect, disorganized speech  Assessment & Plan:   Rash and nonspecific skin eruption Ms.Briones is a 55 yo F w/ PMh of HIV on Symtuza, Paranoid Schizophrenia presenting to [facility] w/ complaint of chronic rash. She mentions that she has not noticed any change in her rash from last visit and has continued to endorse symptoms of pruritus and lower extremity swelling with pain. She mentions that although she understands she needs further work-up to determine etiology of the rash, she mentions that 'today is not a good day' and requests to reschedule. She declined any further history taking.  A/P Presenting w/ chronic rash of extremities and torso. Declined skin biopsy this  visit but willing to reschedule - Reschedule for skin biopsy  Schizophrenia, paranoid (HCC) Presents w/ history of paranoid schizophrenia. Mentions taking risperidone and doxepine as prescribed but has not arranged follow up with Dr.Kaur with psychiatry. She expresses lot of anxiety and concern regarding the planned skin biopsy for this visit.  A/P Sxs appear to be worsening compared to prior visit. Possibly need dose adjustment. Encouraged to Follow up with Dr.Kaur - F/u w/ psychiatry    Patient discussed with Dr. [facility]  -Oswaldo Done, PGY3 [facility] Pager: [phone]

## 2020-06-17 NOTE — Assessment & Plan Note (Signed)
Presents w/ history of paranoid schizophrenia. Mentions taking risperidone and doxepine as prescribed but has not arranged follow up with Dr.Kaur with psychiatry. She expresses lot of anxiety and concern regarding the planned skin biopsy for this visit.  A/P Sxs appear to be worsening compared to prior visit. Possibly need dose adjustment. Encouraged to Follow up with Dr.Kaur - F/u w/ psychiatry

## 2020-06-17 NOTE — Assessment & Plan Note (Addendum)
Anne York is a 55 yo F w/ PMh of HIV on Symtuza, Paranoid Schizophrenia presenting to [facility] w/ complaint of chronic rash. She mentions that she has not noticed any change in her rash from last visit and has continued to endorse symptoms of pruritus and lower extremity swelling with pain. She mentions that although she understands she needs further work-up to determine etiology of the rash, she mentions that 'today is not a good day' and requests to reschedule. She declined any further history taking.  A/P Presenting w/ chronic rash of extremities and torso. Declined skin biopsy this visit but willing to reschedule - Reschedule for skin biopsy

## 2020-06-18 NOTE — Progress Notes (Signed)
Internal Medicine Clinic Attending  Case discussed with Dr. Lee  At the time of the visit.  We reviewed the resident's history and exam and pertinent patient test results.  I agree with the assessment, diagnosis, and plan of care documented in the resident's note.    

## 2020-06-19 ENCOUNTER — Encounter: Payer: Self-pay | Admitting: Student

## 2020-06-22 ENCOUNTER — Encounter: Payer: Self-pay | Admitting: Pulmonary Disease

## 2020-06-22 ENCOUNTER — Other Ambulatory Visit: Payer: Self-pay

## 2020-06-22 ENCOUNTER — Ambulatory Visit (INDEPENDENT_AMBULATORY_CARE_PROVIDER_SITE_OTHER): Payer: Medicaid Other | Admitting: Pulmonary Disease

## 2020-06-22 VITALS — BP 112/74 | HR 89 | Temp 97.6°F | Ht 63.0 in | Wt 177.4 lb

## 2020-06-22 DIAGNOSIS — R0609 Other forms of dyspnea: Secondary | ICD-10-CM

## 2020-06-22 DIAGNOSIS — R0600 Dyspnea, unspecified: Secondary | ICD-10-CM

## 2020-06-22 DIAGNOSIS — J4540 Moderate persistent asthma, uncomplicated: Secondary | ICD-10-CM | POA: Diagnosis not present

## 2020-06-22 DIAGNOSIS — J302 Other seasonal allergic rhinitis: Secondary | ICD-10-CM | POA: Diagnosis not present

## 2020-06-22 MED ORDER — MONTELUKAST SODIUM 10 MG PO TABS: 10.0000 mg | ORAL_TABLET | Freq: Every day | ORAL | 11 refills | Status: AC

## 2020-06-22 MED ORDER — BUDESONIDE-FORMOTEROL FUMARATE 160-4.5 MCG/ACT IN AERO: 2.0000 | INHALATION_SPRAY | Freq: Two times a day (BID) | RESPIRATORY_TRACT | 6 refills | Status: DC

## 2020-06-22 NOTE — Patient Instructions (Addendum)
Nice to meet you  Use Symbicort 2 puffs daily every day.  Rinse her mouth out after every use.  This is a treat what I think is asthma that is not well controlled contributing to your symptoms.  Continue to use albuterol as needed.  To help with your allergy symptoms, use montelukast 1 pill every night.  Both of these new medicines were sent to the [facility] on [address]  I have ordered pulmonary function test or PFTs.  Please call us back and schedule these at your convenience in the coming weeks.  Return to clinic for follow-up with Dr. Judeth Horn in 3 months

## 2020-06-22 NOTE — Progress Notes (Signed)
@Patient  ID: , female    DOB: 1965/06/22, 55 y.o.   MRN: [id number]  Chief Complaint  Patient presents with  . Consult    Referring provider: [id number], NP  HPI:   55 year old whom we are seeing for evaluation of dyspnea on exertion.  PCP note x2 reviewed.  Longstanding history of asthma.  Relatively well controlled overall.  However she notes since her second bout of COVID in 04/2019 she has had worsening dyspnea on exertion, cough.  Dyspnea on exertion worse with stairs or inclines are present on flat surfaces as well.  Cough is largely dry, occasionally productive.  No time of day her symptoms are better or worse.  Symptoms are worse with pollen increase.  No environmental factors that would account for the change in symptoms.  Albuterol helps mildly with her symptoms.  Has not been using a maintenance inhaler in some time.  Unsure of the name of prior maintenance inhalers.  Has received least 1-2 courses of oral prednisone.  This makes her very jittery but she thinks it did help her breathing for short period of time.   Reviewed chest x-ray 04/2020 which on my interpretation reveals clear lungs, no effusion, infiltrate, pneumothorax.  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  MMRC: No flowsheet data found.  Epworth:  No flowsheet data found.  Tests:   FENO:  No results found for: NITRICOXIDE  PFT: No flowsheet data found.  WALK:  No flowsheet data found.  Imaging: Personally reviewed as per EMR discussion in this note  Lab Results: Personally reviewed and as per  EMR CBC    Component Value Date/Time   WBC 4.0 03/31/2020 1556   RBC 3.85 03/31/2020 1556   HGB 12.8 03/31/2020 1556   HCT 37.9 03/31/2020 1556   PLT 199 03/31/2020 1556   MCV 98.4 03/31/2020 1556   MCH 33.2 (H) 03/31/2020 1556   MCHC 33.8 03/31/2020 1556   RDW 14.7 03/31/2020 1556   LYMPHSABS 1,388 03/31/2020 1556   EOSABS 72 03/31/2020 1556   BASOSABS 32 03/31/2020 1556    BMET    Component Value Date/Time   NA 140 03/31/2020 1556   K 4.4 03/31/2020 1556   CL 109 03/31/2020 1556   CO2 23 03/31/2020 1556   GLUCOSE 74 03/31/2020 1556   BUN 25 03/31/2020 1556   CREATININE 0.84 03/31/2020 1556   CALCIUM 8.9 03/31/2020 1556   GFRNONAA 79 03/31/2020 1556   GFRAA 92 03/31/2020 1556    BNP No results found for: BNP  ProBNP No results found for: PROBNP  Specialty Problems      Pulmonary Problems   Chronic shortness of breath      No Known Allergies  Immunization History  Administered Date(s) Administered  . Hepatitis A 02/29/2016, 09/15/2016  . Hepatitis B 03/06/2012, 10/11/2012, 01/23/2013  . Influenza-Unspecified 11/30/2011, 10/11/2012, 11/10/2013, 03/10/2016, 03/08/2017, 02/06/2018, 02/26/2019, 11/21/2019  . Meningococcal Mcv4o 03/08/2017, 02/06/2018  . Moderna Sars-Covid-2 Vaccination 08/15/2019, 09/19/2019  . Pneumococcal Polysaccharide-23 07/27/2015  . Zoster 02/26/2019    Past Medical History:  Diagnosis Date  . GAD (generalized anxiety disorder)   . GERD (  gastroesophageal reflux disease)   . Gout   . HIV (human immunodeficiency virus infection) (HCC) Dx 2005  . IDA (iron deficiency anemia)   . Schizophrenia (HCC)     Tobacco History: Social History   Tobacco Use  Smoking Status Former Smoker  . Types: Cigarettes  Smokeless Tobacco Never Used   Counseling given: Not Answered   Continue to not smoke  Outpatient Encounter Medications as of 06/22/2020  Medication Sig  . albuterol (VENTOLIN HFA) 108 (90 Base) MCG/ACT inhaler Inhale 1 puff  into the lungs every 4 (four) hours as needed for wheezing or shortness of breath.  . allopurinol (ZYLOPRIM) 100 MG tablet Take 100 mg by mouth daily.  . budesonide-formoterol (SYMBICORT) 160-4.5 MCG/ACT inhaler Inhale 2 puffs into the lungs in the morning and at bedtime.  . clonazePAM (KLONOPIN) 0.5 MG tablet Take 1 tablet (0.5 mg total) by mouth 2 (two) times daily as needed for anxiety.  . Darunavir-Cobicisctat-Emtricitabine-Tenofovir Alafenamide (SYMTUZA) 800-150-200-10 MG TABS TAKE 1 TABLET BY MOUTH DAILY WITH BREAKFAST.  Marland Kitchen doxepin (SINEQUAN) 100 MG capsule Take 1 capsule (100 mg total) by mouth at bedtime.  . hydrocortisone 2.5 % ointment Apply topically 2 (two) times daily.  . montelukast (SINGULAIR) 10 MG tablet Take 1 tablet (10 mg total) by mouth at bedtime.  . risperiDONE (RISPERDAL) 2 MG tablet Take 1 tablet (2 mg total) by mouth at bedtime.  . [DISCONTINUED] amoxicillin-clavulanate (AUGMENTIN) 875-125 MG tablet Take 1 tablet by mouth 2 (two) times daily. One po bid x 7 days   No facility-administered encounter medications on file as of 06/22/2020.     Review of Systems  Review of Systems  No chest pain with exertion.  No orthopnea or PND.  No lower extremity swelling.  Comprehensive review systems otherwise negative. Physical Exam  BP 112/74 (BP Location: Left Arm, Cuff Size: Normal)   Pulse 89   Temp 97.6 F (36.4 C) (Oral)   Ht 5\' 3"  (1.6 m)   Wt 177 lb 6.4 oz (80.5 kg)   SpO2 100%   BMI 31.42 kg/m   Wt Readings from Last 5 Encounters:  06/22/20 177 lb 6.4 oz (80.5 kg)  06/17/20 177 lb 4.8 oz (80.4 kg)  06/02/20 173 lb 3.2 oz (78.6 kg)  05/05/20 174 lb (78.9 kg)  04/28/20 175 lb (79.4 kg)    BMI Readings from Last 5 Encounters:  06/22/20 31.42 kg/m  06/17/20 31.41 kg/m  06/02/20 30.68 kg/m  05/05/20 29.87 kg/m  04/28/20 30.04 kg/m     Physical Exam General: Well-appearing, no acute distress Eyes: EOMI, no icterus Neck: Supple, no JVP Respiratory:  Low pitched wheeze on the left more prominent high-pitched wheeze on the right, diffuse, normal work of breathing Cardiovascular: Regular rhythm, no murmurs Abdomen: Nondistended, tenderness present MSK: No synovitis, no joint effusion Neuro: Normal gait, no weakness Psych: Normal mood, full affect   Assessment & Plan:   Dyspnea on exertion: Worsened since COVID infection 04/2019.  Associated with chest tightness.  Mild improvement albuterol.  Short term improvement with oral prednisone courses.  Suspect related to uncontrolled asthma exacerbated by COVID infection.  See below.  Will obtain PFTs in the coming weeks.  Asthma: Poorly controlled.  Wheeze, chest tightness, dyspnea on exertion.  Not on maintenance inhaler.  Start Symbicort high-dose 2 puffs twice daily.  Continue albuterol as needed.  PFTs as above.  Consider phenotyping at upcoming visit if not improving.   Return in about 3 months (around 09/22/2020).   11/22/2020  Karie Fetch, MD 06/22/2020

## 2020-06-23 ENCOUNTER — Other Ambulatory Visit: Payer: Self-pay

## 2020-07-01 ENCOUNTER — Other Ambulatory Visit (HOSPITAL_COMMUNITY): Payer: Self-pay

## 2020-07-02 ENCOUNTER — Other Ambulatory Visit: Payer: Self-pay

## 2020-07-02 ENCOUNTER — Encounter (HOSPITAL_COMMUNITY): Payer: Self-pay | Admitting: Psychiatry

## 2020-07-02 ENCOUNTER — Telehealth (INDEPENDENT_AMBULATORY_CARE_PROVIDER_SITE_OTHER): Payer: Medicaid Other | Admitting: Psychiatry

## 2020-07-02 DIAGNOSIS — F200 Paranoid schizophrenia: Secondary | ICD-10-CM

## 2020-07-02 DIAGNOSIS — F419 Anxiety disorder, unspecified: Secondary | ICD-10-CM | POA: Diagnosis not present

## 2020-07-02 MED ORDER — CLONAZEPAM 0.5 MG PO TABS
0.5000 mg | ORAL_TABLET | Freq: Two times a day (BID) | ORAL | 1 refills | Status: DC | PRN
Start: 2020-07-02 — End: 2020-08-31

## 2020-07-02 MED ORDER — SERTRALINE HCL 50 MG PO TABS: 50.0000 mg | ORAL_TABLET | Freq: Every day | ORAL | 1 refills | Status: DC

## 2020-07-02 MED ORDER — RISPERIDONE 2 MG PO TABS: 2.0000 mg | ORAL_TABLET | Freq: Every day | ORAL | 1 refills | Status: DC

## 2020-07-02 MED ORDER — DOXEPIN HCL 100 MG PO CAPS: 100.0000 mg | ORAL_CAPSULE | Freq: Every day | ORAL | 1 refills | Status: DC

## 2020-07-02 NOTE — Progress Notes (Signed)
BH OP Progress Note   Virtual Visit via Video Note  I connected with [name] on 07/02/20 at 10:00 AM EDT by a video enabled telemedicine application and verified that I am speaking with the correct person using two identifiers.  Location: Patient: Home Provider: Clinic   I discussed the limitations of evaluation and management by telemedicine and the availability of in person appointments. The patient expressed understanding and agreed to proceed.  I provided 17 minutes of non-face-to-face time during this encounter.     Patient Identification: Anne York MRN:  [id number] Date of Evaluation:  07/02/2020    Chief Complaint:   " The Klonopin is helping me a lot."  Visit Diagnosis:    ICD-10-CM   1. Schizophrenia, paranoid (HCC)  F20.0   2. Anxiety  F41.9     History of Present Illness: Patient reported that she is doing well overall however she has noticed that she has been crying more and is feeling depressed lately. Patient appears to be well groomed and much calmer today upon assessment. She stated that clonazepam has helped her with her anxiety significantly and she is very grateful that she was prescribed the increased dose.  However she stated that she has been depressed and she believes that this is due to the medications.  She stated that she also asked the writer if she can go back to taking her original medications Seroquel and trazodone. Writer asked her if risperidone was helping her hallucinations and paranoia and she replied that she has had significant improvement in her auditory and visual hallucinations with risperidone.  She also informed that she is sleeping okay with the help of doxepin except for a few nights here and there. She stated that she has not had to visit the urgent care or ED because of anxiety or panic attacks lately. Based on this writer recommended if she would like to continue taking risperidone and doxepin as it seemed to be helping her with  her target symptoms.  Writer recommended adding sertraline that can help with her depression symptoms and also help with anxiety. Patient stated that she was willing to try that.  She informed that she has probably taken it in the past but does not remember if it helped or not. She stated that she was thankful to the writer helping her out and agreed with the plan of adding Zoloft to her regimen. She also happily informed that she recently found out that her Medicaid was active since January and as a result she wants all the prescriptions to be sent to [facility] that is very close to her house. Potential side effects of medication and risks vs benefits of treatment vs non-treatment were explained and discussed. All questions were answered.   Past Psychiatric History: Schizophrenia, anxiety, depression  Previous Psychotropic Medications: Yes   Substance Abuse History in the last 12 months:  No.  Consequences of Substance Abuse: NA  Past Medical History: HIV Infection  Family Psychiatric History: denied  Family History:  Family History  Problem Relation Age of Onset  . Diabetes Mother   . Hypertension Mother   . Diabetes Brother   . Kidney failure Other     Social History:   Social History   Socioeconomic History  . Marital status: Single    Spouse name: Not on file  . Number of children: Not on file  . Years of education: Not on file  . Highest education level: Not on file  Occupational History  .  Not on file  Tobacco Use  . Smoking status: Former Smoker    Types: Cigarettes  . Smokeless tobacco: Never Used  Substance and Sexual Activity  . Alcohol use: Never  . Drug use: Yes    Types: Marijuana  . Sexual activity: Not Currently  Other Topics Concern  . Not on file  Social History Narrative  . Not on file   Social Determinants of Health   Financial Resource Strain: Not on file  Food Insecurity: Not on file  Transportation Needs: Not on file  Physical Activity: Not  on file  Stress: Not on file  Social Connections: Not on file    Additional Social History: Lives by herself, unemployed. Used to receive disability in [place]. Son lives in [place]  Allergies:  No Known Allergies  Metabolic Disorder Labs: No results found for: HGBA1C, MPG No results found for: PROLACTIN No results found for: CHOL, TRIG, HDL, CHOLHDL, VLDL, LDLCALC No results found for: TSH  Therapeutic Level Labs: No results found for: LITHIUM No results found for: CBMZ No results found for: VALPROATE  Current Medications: Current Outpatient Medications  Medication Sig Dispense Refill  . albuterol (VENTOLIN HFA) 108 (90 Base) MCG/ACT inhaler Inhale 1 puff into the lungs every 4 (four) hours as needed for wheezing or shortness of breath.    . allopurinol (ZYLOPRIM) 100 MG tablet Take 100 mg by mouth daily.    . budesonide-formoterol (SYMBICORT) 160-4.5 MCG/ACT inhaler Inhale 2 puffs into the lungs in the morning and at bedtime. 1 each 6  . clonazePAM (KLONOPIN) 0.5 MG tablet Take 1 tablet (0.5 mg total) by mouth 2 (two) times daily as needed for anxiety. 60 tablet 1  . Darunavir-Cobicisctat-Emtricitabine-Tenofovir Alafenamide (SYMTUZA) 800-150-200-10 MG TABS TAKE 1 TABLET BY MOUTH DAILY WITH BREAKFAST. 30 tablet 5  . doxepin (SINEQUAN) 100 MG capsule Take 1 capsule (100 mg total) by mouth at bedtime. 30 capsule 1  . hydrocortisone 2.5 % ointment Apply topically 2 (two) times daily.    . montelukast (SINGULAIR) 10 MG tablet Take 1 tablet (10 mg total) by mouth at bedtime. 30 tablet 11  . risperiDONE (RISPERDAL) 2 MG tablet Take 1 tablet (2 mg total) by mouth at bedtime. 30 tablet 1   No current facility-administered medications for this visit.      Psychiatric Specialty Exam: Review of Systems  There were no vitals taken for this visit.There is no height or weight on file to calculate BMI.  General Appearance: Well Groomed  Eye Contact:  Good  Speech:  Clear and Coherent and  Normal Rate  Volume:  Normal  Mood:  Euthymic  Affect:  Congruent  Thought Process:  Goal Directed and Descriptions of Associations: Intact  Orientation:  Full (Time, Place, and Person)  Thought Content:  Logical and Hallucinations: Auditory improved  Suicidal Thoughts:  No  Homicidal Thoughts:  No  Memory:  Immediate;   Good Recent;   Fair  Judgement:  Fair  Insight:  Fair  Psychomotor Activity:  Normal  Concentration:  Concentration: Good and Attention Span: Good  Recall:  Good  Fund of Knowledge:Good  Language: Good  Akathisia:  Negative  Handed:  Right  AIMS (if indicated):  not done due to telemed visit  Assets:  Communication Skills Desire for Improvement Housing  ADL's:  Intact  Cognition: WNL  Sleep:  Fair   GAD-7   Flowsheet Row Office Visit from 06/02/2020 in [facility]  Total GAD-7 Score 19  PHQ2-9   Flowsheet Row Office Visit from 06/02/2020 in [facility] Office Visit from [facility]/31/2020 in [facility]  PHQ-2 Total Score 2 2  PHQ-9 Total Score 14 --    Flowsheet Row ED from 04/28/2020 in [facility]  C-SSRS RISK CATEGORY No Risk      Assessment and Plan: Patient appears to be doing much better compared to her last visit.  Patient complained of feeling depressed with increased crying spells.  Writer recommended adding sertraline to target the symptoms.  We will continue the remaining medications. Potential side effects of medication and risks vs benefits of treatment vs non-treatment were explained and discussed. All questions were answered.   1. Schizophrenia, paranoid (HCC)  - doxepin (SINEQUAN) 100 MG capsule; Take 1 capsule (100 mg total) by mouth at bedtime.  Dispense: 30 capsule; Refill: 1 - risperiDONE (RISPERDAL) 2 MG tablet; Take 1 tablet (2 mg total) by mouth at bedtime.  Dispense: 30 tablet; Refill: 1 - Start sertraline (ZOLOFT) 50 MG  tablet; Take 1 tablet (50 mg total) by mouth daily with breakfast.  Dispense: 30 tablet; Refill: 1  2. Anxiety  - clonazePAM (KLONOPIN) 0.5 MG tablet; Take 1 tablet (0.5 mg total) by mouth 2 (two) times daily as needed for anxiety.  Dispense: 60 tablet; Refill: 1 - Start sertraline (ZOLOFT) 50 MG tablet; Take 1 tablet (50 mg total) by mouth daily with breakfast.  Dispense: 30 tablet; Refill: 1  Started Zoloft, continue rest of the regimen. F/up in 2 months.  Patient was informed that her care is being transferred to a different provider in the clinic due to the writer leaving the office.  Zena Amos, MD 5/12/20229:52 AM

## 2020-07-03 ENCOUNTER — Encounter: Payer: Self-pay | Admitting: Infectious Diseases

## 2020-07-03 ENCOUNTER — Other Ambulatory Visit: Payer: Self-pay

## 2020-07-03 ENCOUNTER — Other Ambulatory Visit (HOSPITAL_COMMUNITY): Payer: Self-pay

## 2020-07-03 ENCOUNTER — Ambulatory Visit (INDEPENDENT_AMBULATORY_CARE_PROVIDER_SITE_OTHER): Payer: Medicaid Other | Admitting: Infectious Diseases

## 2020-07-03 VITALS — BP 121/80 | HR 98 | Wt 173.0 lb

## 2020-07-03 DIAGNOSIS — R21 Rash and other nonspecific skin eruption: Secondary | ICD-10-CM | POA: Diagnosis not present

## 2020-07-03 DIAGNOSIS — R0602 Shortness of breath: Secondary | ICD-10-CM

## 2020-07-03 DIAGNOSIS — M79672 Pain in left foot: Secondary | ICD-10-CM | POA: Diagnosis not present

## 2020-07-03 DIAGNOSIS — Z21 Asymptomatic human immunodeficiency virus [HIV] infection status: Secondary | ICD-10-CM | POA: Diagnosis not present

## 2020-07-03 DIAGNOSIS — K921 Melena: Secondary | ICD-10-CM | POA: Diagnosis present

## 2020-07-03 DIAGNOSIS — M79671 Pain in right foot: Secondary | ICD-10-CM | POA: Diagnosis not present

## 2020-07-03 DIAGNOSIS — F200 Paranoid schizophrenia: Secondary | ICD-10-CM

## 2020-07-03 MED ORDER — CELECOXIB 100 MG PO CAPS: 100.0000 mg | ORAL_CAPSULE | Freq: Two times a day (BID) | ORAL | 0 refills | Status: DC

## 2020-07-03 NOTE — Assessment & Plan Note (Addendum)
FU recently with her mental health team. Started on zoloft to help with anxiety management in addition to Clonazapam by Dr. Evelene Croon. [facility] (another provider d/t Dr. Carie Caddy departure from the practice).

## 2020-07-03 NOTE — Assessment & Plan Note (Signed)
She is not willing to switch her HIV medication to accommodate for inhalers, of which she stopped taking anyway. Will continue Symtuza once daily for now.

## 2020-07-03 NOTE — Assessment & Plan Note (Signed)
While she may very well be correct that she could have lingering effect from COVID, I encouraged her to not give up on recommendations if they do not work the first time and she may need further testing to help define how severe her lung disease is (whether from COVID fibrosis or restrictive lung disease).   Regarding inhaled steroids - since she is not willing to change HIV medication would ask pulmonology if there is any benefit from non-steroid inhaler or if there is an option to use beclomethasone containing formula as it is less dependent on the drug metabolising pathways that Symtuza interacts with.   I asked her to please schedule a follow up to review other options.

## 2020-07-03 NOTE — Assessment & Plan Note (Signed)
She states this is due to gout and relieved from Aleve. With complaint of abdominal pain and rectal bleeding will trial her on moderate dose celebrex twice a day. I asked her to please follow up with her primary care team regarding other recommendations / work up.

## 2020-07-03 NOTE — Assessment & Plan Note (Signed)
New problem for Anne York. Based on description of abdominal pain and difficulty passing stools I suspect it is most related to chronic constipation leading to superficial bleeding, possibly anal fissure. No red flag symptoms present. Will advise to start bowel protocol with stool softeners/laxatives daily. Increase fiber and water intake.   She has never had a colonoscopy and requesting one. Will make referral to GI for consideration of colonoscopy and follow up on effect of Miralax.

## 2020-07-03 NOTE — Assessment & Plan Note (Signed)
We spent time discussing skin biopsy and how it is probably going to be the most helpful thing to determine cause of the rash and ultimately how to correct it. She is most concerned about her ability to "heal up a hole due to her HIV." I reminded her that she has had a phenomenal recovery of her immune system and her condition is well controlled and I felt very comfortable with her proceeding with the procedure and feel she is at no greater risk than general population. She will call Internal Medicine team to reschedule.

## 2020-07-03 NOTE — Patient Instructions (Addendum)
For the pain - instead of naproxen, try taking the celebrex medication I will send in for you.   Miralax is the over the counter medication I want you to start this everyday to help reduce your.   Please give your primary care team a call to schedule an appointment to talk more about the biopsy and follow up on how the celebrex is working for your foot pain.   For now can continue your Symtuza like you have been if you are not going to do the inhalers the lung team recommended. I think there are other options that they can help consider for you to get your breathing better you can talk about at your next appointment.   Will see you back in 4 months here in clinic.

## 2020-07-03 NOTE — Progress Notes (Signed)
Name: Anne York  DOB: [birth year] MRN: [id number] PCP: Gaylan Gerold, DO  Pulmonologist: Dr. Lamonte Sakai    Brief Narrative:  Anne York is a 55 y.o. female with HIV dx 2005 in [place] at [facility].  CD4 nadir < 200  HIV Risk: heterosexual  History of OIs: none Intake Labs 2022: Hep B sAg (-), sAb (+), cAb (-); Hep A (+), Hep C (-) Quantiferon (-) HLA B*5701 (-) G6PD: ()   Previous Regimens: . 2013 - Prezista + Norvir + Truvada  . Symtuza   Genotypes: . 2014 - K103N noted     Subjective:   Chief Complaint  Patient presents with  . Follow-up    Reports feet pain; dark blood in stool; noticed within last week; some pain with Bm's; some abdominal pain reported; declined condoms;      HPI: Anne York is here today at my request to discuss drug interaction with inhalers and Symtuza. She says upfront that she is absolutely not changing her HIV medication. The inhalers are "of no help to her" and she continues to describe how this is all related to COVID infection damage. She is certain of this. She is uncertain how long she tried the steroid inhalers but continues to use her albuterol at least daily.   She describes a few new complaints - dark blood in stool when she wipes, infrequent BMs that are hard to pass (maybe 1-2 a week), abdominal pain and foot toe pain. She reports no blood when she wipes without BMs, only when one is passed. She is very worried about this.   She is considering doing the skin biopsy. She was opposed initially to this because she did not want to have to heal from something or have a hole in her skin. However she is completely frustrated by the rash that is still present without improvement or worsening of condition.   She is using frequent doses of Aleve for the toe pain she attributes to gout. This does relieve her pain. She has not called her primary care doctor about any of these changes. She is very frustrated that we "separate everything here and  it is so different form [place]."    Review of Systems  Constitutional: Negative for appetite change, chills, fatigue, fever and unexpected weight change.  HENT: Negative for mouth sores, sore throat and trouble swallowing.   Eyes: Negative for pain and visual disturbance.  Respiratory: Negative for cough and shortness of breath.   Cardiovascular: Negative for chest pain.  Gastrointestinal: Positive for abdominal pain, blood in stool and constipation. Negative for diarrhea and nausea.  Genitourinary: Negative for dysuria, menstrual problem and pelvic pain.  Musculoskeletal: Positive for arthralgias. Negative for back pain, joint swelling and neck pain.  Skin: Positive for color change and rash.  Neurological: Negative for weakness, numbness and headaches.  Hematological: Negative for adenopathy.  Psychiatric/Behavioral: Negative for dysphoric mood. The patient is not nervous/anxious.      Past Medical History:  Diagnosis Date  . GAD (generalized anxiety disorder)   . GERD (gastroesophageal reflux disease)   . Gout   . HIV (human immunodeficiency virus infection) ([place]) Dx 2005  . IDA (iron deficiency anemia)   . Schizophrenia [facility])     Outpatient Medications Prior to Visit  Medication Sig Dispense Refill  . albuterol (VENTOLIN HFA) 108 (90 Base) MCG/ACT inhaler Inhale 1 puff into the lungs every 4 (four) hours as needed for wheezing or shortness of breath.    . budesonide-formoterol (  SYMBICORT) 160-4.5 MCG/ACT inhaler Inhale 2 puffs into the lungs in the morning and at bedtime. 1 each 6  . clonazePAM (KLONOPIN) 0.5 MG tablet Take 1 tablet (0.5 mg total) by mouth 2 (two) times daily as needed for anxiety. 60 tablet 1  . Darunavir-Cobicisctat-Emtricitabine-Tenofovir Alafenamide (SYMTUZA) 800-150-200-10 MG TABS TAKE 1 TABLET BY MOUTH DAILY WITH BREAKFAST. 30 tablet 5  . risperiDONE (RISPERDAL) 2 MG tablet Take 1 tablet (2 mg total) by mouth at bedtime. 30 tablet 1  . sertraline  (ZOLOFT) 50 MG tablet Take 1 tablet (50 mg total) by mouth daily with breakfast. 30 tablet 1  . allopurinol (ZYLOPRIM) 100 MG tablet Take 100 mg by mouth daily.    Marland Kitchen doxepin (SINEQUAN) 100 MG capsule Take 1 capsule (100 mg total) by mouth at bedtime. 30 capsule 1  . hydrocortisone 2.5 % ointment Apply topically 2 (two) times daily.    . montelukast (SINGULAIR) 10 MG tablet Take 1 tablet (10 mg total) by mouth at bedtime. 30 tablet 11   No facility-administered medications prior to visit.     No Known Allergies  Social History   Tobacco Use  . Smoking status: Former Smoker    Types: Cigarettes  . Smokeless tobacco: Never Used  Substance Use Topics  . Alcohol use: Never  . Drug use: Yes    Types: Marijuana    Family History  Problem Relation Age of Onset  . Diabetes Mother   . Hypertension Mother   . Diabetes Brother   . Kidney failure Other     Social History   Substance and Sexual Activity  Sexual Activity Not Currently     Objective:   Vitals:   07/03/20 1003  BP: 121/80  Pulse: 98  Weight: 173 lb (78.5 kg)   Body mass index is 30.65 kg/m.  Physical Exam HENT:     Mouth/Throat:     Mouth: No oral lesions.     Dentition: No dental abscesses.  Cardiovascular:     Rate and Rhythm: Normal rate and regular rhythm.     Heart sounds: Normal heart sounds.  Pulmonary:     Effort: Pulmonary effort is normal.     Breath sounds: Rhonchi present.  Abdominal:     General: There is no distension.     Palpations: Abdomen is soft.     Tenderness: There is no abdominal tenderness.  Musculoskeletal:        General: No tenderness. Normal range of motion.  Lymphadenopathy:     Cervical: No cervical adenopathy.  Skin:    General: Skin is warm and dry.     Findings: Rash (flat macular hyperpigmented rash that is unchanged at the base of her back. ) present.  Neurological:     Mental Status: She is alert and oriented to person, place, and time.  Psychiatric:         Judgment: Judgment normal.     Lab Results Lab Results  Component Value Date   WBC 4.0 03/31/2020   HGB 12.8 03/31/2020   HCT 37.9 03/31/2020   MCV 98.4 03/31/2020   PLT 199 03/31/2020    Lab Results  Component Value Date   CREATININE 0.84 03/31/2020   BUN 25 03/31/2020   NA 140 03/31/2020   K 4.4 03/31/2020   CL 109 03/31/2020   CO2 23 03/31/2020    Lab Results  Component Value Date   ALT 15 03/31/2020   AST 26 03/31/2020   BILITOT 0.3 03/31/2020  No results found for: CHOL, HDL, LDLCALC, LDLDIRECT, TRIG, CHOLHDL HIV 1 RNA Quant (Copies/mL)  Date Value  03/31/2020 <20   CD4 T Cell Abs (/uL)  Date Value  03/31/2020 457     Assessment & Plan:   Problem List Items Addressed This Visit      Unprioritized   Schizophrenia, paranoid ([place])    FU recently with her mental health team. Started on zoloft to help with anxiety management in addition to Clonazapam by Dr. Toy Care. She has follow up arranged with [facility] team (another provider d/t Dr. Starleen Arms departure from the practice).       Rash and nonspecific skin eruption    We spent time discussing skin biopsy and how it is probably going to be the most helpful thing to determine cause of the rash and ultimately how to correct it. She is most concerned about her ability to "heal up a hole due to her HIV." I reminded her that she has had a phenomenal recovery of her immune system and her condition is well controlled and I felt very comfortable with her proceeding with the procedure and feel she is at no greater risk than general population. She will call Internal Medicine team to reschedule.       Pain in both feet    She states this is due to gout and relieved from Aleve. With complaint of abdominal pain and rectal bleeding will trial her on moderate dose celebrex twice a day. I asked her to please follow up with her primary care team regarding other recommendations / work up.       Relevant Medications   celecoxib  (CELEBREX) 100 MG capsule   HIV (human immunodeficiency virus infection) ([place])    She is not willing to switch her HIV medication to accommodate for inhalers, of which she stopped taking anyway. Will continue Symtuza once daily for now.      Chronic shortness of breath    While she may very well be correct that she could have lingering effect from COVID, I encouraged her to not give up on recommendations if they do not work the first time and she may need further testing to help define how severe her lung disease is (whether from COVID fibrosis or restrictive lung disease).   Regarding inhaled steroids - since she is not willing to change HIV medication would ask pulmonology if there is any benefit from non-steroid inhaler or if there is an option to use beclomethasone containing formula as it is less dependent on the drug metabolising pathways that [place] interacts with.   I asked her to please schedule a follow up to review other options.        Blood in stool - Primary    New problem for Anne York. Based on description of abdominal pain and difficulty passing stools I suspect it is most related to chronic constipation leading to superficial bleeding, possibly anal fissure. No red flag symptoms present. Will advise to start bowel protocol with stool softeners/laxatives daily. Increase fiber and water intake.   She has never had a colonoscopy and requesting one. Will make referral to GI for consideration of colonoscopy and follow up on effect of Miralax.       Relevant Orders   Ambulatory referral to Gastroenterology      Janene Madeira, MSN, NP-C [place] for [place] Pager: [phone] Office: [phone]  07/03/20  7:22 PM

## 2020-07-05 ENCOUNTER — Other Ambulatory Visit: Payer: Self-pay | Admitting: Pulmonary Disease

## 2020-07-05 MED ORDER — BECLOMETHASONE DIPROP HFA 80 MCG/ACT IN AERB
2.0000 | INHALATION_SPRAY | Freq: Two times a day (BID) | RESPIRATORY_TRACT | 3 refills | Status: AC
Start: 2020-07-05 — End: ?

## 2020-07-05 NOTE — Progress Notes (Signed)
Reviewed recent ID note. Will prescribe Qvar for asthma given less interaction with ART. Please inform patient to not use symbicort and replace with new inhaler Qvar 2 puff BID.

## 2020-07-06 ENCOUNTER — Other Ambulatory Visit (HOSPITAL_COMMUNITY): Payer: Self-pay

## 2020-07-06 NOTE — Progress Notes (Signed)
Called and spoke with patient. She is aware to D/C the Symbicort and start the Qvar.   Nothing further needed at time of call.

## 2020-07-09 ENCOUNTER — Other Ambulatory Visit (HOSPITAL_COMMUNITY): Payer: Self-pay

## 2020-07-22 ENCOUNTER — Other Ambulatory Visit (HOSPITAL_COMMUNITY): Payer: Self-pay

## 2020-07-28 ENCOUNTER — Other Ambulatory Visit (HOSPITAL_COMMUNITY): Payer: Self-pay

## 2020-08-03 ENCOUNTER — Other Ambulatory Visit: Payer: Self-pay | Admitting: Family

## 2020-08-03 DIAGNOSIS — Z21 Asymptomatic human immunodeficiency virus [HIV] infection status: Secondary | ICD-10-CM

## 2020-08-04 ENCOUNTER — Other Ambulatory Visit (HOSPITAL_COMMUNITY): Payer: Self-pay

## 2020-08-18 ENCOUNTER — Telehealth (HOSPITAL_COMMUNITY): Payer: Self-pay | Admitting: *Deleted

## 2020-08-18 NOTE — Telephone Encounter (Signed)
Per request of Anne York my supervisor I contacted patients case manager Anne York who had questions re her medicines. I called the pharmacy to verify her meds and the last fill date. She has been out of her Klonopin for two weeks but has a refill she can pick up today, and is late on her risperidone but current on her other two meds. Anne York would like her medicines moved to [facility] and will do that on her next visit which is 7/11. Will change her preferred pharmacy

## 2020-08-27 ENCOUNTER — Other Ambulatory Visit (HOSPITAL_COMMUNITY): Payer: Self-pay

## 2020-08-31 ENCOUNTER — Telehealth (INDEPENDENT_AMBULATORY_CARE_PROVIDER_SITE_OTHER): Payer: Medicaid Other | Admitting: Psychiatry

## 2020-08-31 ENCOUNTER — Other Ambulatory Visit (HOSPITAL_COMMUNITY): Payer: Self-pay

## 2020-08-31 ENCOUNTER — Encounter (HOSPITAL_COMMUNITY): Payer: Self-pay | Admitting: Psychiatry

## 2020-08-31 DIAGNOSIS — F419 Anxiety disorder, unspecified: Secondary | ICD-10-CM

## 2020-08-31 DIAGNOSIS — F200 Paranoid schizophrenia: Secondary | ICD-10-CM

## 2020-08-31 MED ORDER — CLONAZEPAM 0.5 MG PO TABS
0.5000 mg | ORAL_TABLET | Freq: Two times a day (BID) | ORAL | 1 refills | Status: DC | PRN
Start: 2020-08-31 — End: 2020-12-01

## 2020-08-31 MED ORDER — HYDROXYZINE HCL 10 MG PO TABS: 10.0000 mg | ORAL_TABLET | Freq: Three times a day (TID) | ORAL | 2 refills | Status: DC | PRN

## 2020-08-31 MED ORDER — DOXEPIN HCL 100 MG PO CAPS: 100.0000 mg | ORAL_CAPSULE | Freq: Every day | ORAL | 2 refills | Status: DC

## 2020-08-31 MED ORDER — SERTRALINE HCL 100 MG PO TABS: 100.0000 mg | ORAL_TABLET | Freq: Every day | ORAL | 2 refills | Status: DC

## 2020-08-31 MED ORDER — RISPERIDONE 3 MG PO TABS: 3.0000 mg | ORAL_TABLET | Freq: Every day | ORAL | 2 refills | Status: DC

## 2020-08-31 NOTE — Progress Notes (Signed)
BH MD/PA/NP OP Progress Note Virtual Visit via Telephone Note  I connected with Anacristina Steffek on 08/31/20 at 10:00 AM EDT by telephone and verified that I am speaking with the correct person using two identifiers.  Location: Patient: home Provider: Clinic   I discussed the limitations, risks, security and privacy concerns of performing an evaluation and management service by telephone and the availability of in person appointments. I also discussed with the patient that there may be a patient responsible charge related to this service. The patient expressed understanding and agreed to proceed.   I provided 30 minutes of non-face-to-face time during this encounter.  08/31/2020 11:46 AM Tierre Gerard  MRN:  [id number]  Chief Complaint: "I'm struggling. I didn't had my meds for two weeks"  HPI: 55 year old female seen today for follow-up psychiatric evaluation.  She is a former patient of Dr. Quintella Baton who is being transferred to writer for medication management.  She has a psychiatric history of schizophrenia, anxiety, and depression.  She is currently managed on Klonopin 0.5 mg twice daily, Risperdal 2 mg nightly, Zoloft 50 mg daily, doxepin 100 mg nightly.  She notes medications are somewhat effective in managing her psychiatric conditions.  Today she is pleasant, cooperative, and engaged in conversation.  Patient was unable to logon virtually for assistance on the phone.  During exam she notes that she has been struggling.  She informed Clinical research associate that she was out of her medications for over 2 weeks and has only be worn by provider en on them for the last 10 days.  She informed Clinical research associate that she wakes up nightly having a panic attack.  She notes that in the past are higher dose of Klonopin to help managed her anxiety.  Patient informed that Klonopin would not be increased at this time.  She endorsed understanding and agreed.  Today provider conducted a GAD-7 and patient scored a 19.  She informed Clinical research associate  that she worries about everything and notes that she has racing thoughts.  She also informed Clinical research associate that she is irritable most days, distractible, and has auditory hallucinations (she describes the [facility] as voices of self doubt).  Provider also conducted a PHQ-9 and patient scored a 20.  She notes that his sleep is poor (noting that at times she feels 2 days without sleep) however endorses having an adequate appetite.  Patient informed Clinical research associate that she wants any medications that can help her not feel fearful, overwhelmed, or frightened.  Patient is agreeable to starting hydroxyzine 10 mg 3 times daily to help manage anxiety.  She is also agreeable to increasing Risperdal 2 mg nightly to 3 mg nightly to help manage symptoms of psychosis and depression.  She will increase Zoloft 50 mg to 100 mg to help manage anxiety and depression. Potential side effects of medication and risks vs benefits of treatment vs non-treatment were explained and discussed. All questions were answered.  Provider asked patient if she was interested in therapy.  She notes at this time she does not want therapies.  No other concerns noted at this time.  Visit Diagnosis:    ICD-10-CM   1. Schizophrenia, paranoid (HCC)  F20.0 sertraline (ZOLOFT) 100 MG tablet    risperiDONE (RISPERDAL) 3 MG tablet    doxepin (SINEQUAN) 100 MG capsule    2. Anxiety  F41.9 sertraline (ZOLOFT) 100 MG tablet    clonazePAM (KLONOPIN) 0.5 MG tablet    hydrOXYzine (ATARAX/VISTARIL) 10 MG tablet      Past Psychiatric History:  Schizophrenia, anxiety, depression    Past Medical History:  Past Medical History:  Diagnosis Date   GAD (generalized anxiety disorder)    GERD (gastroesophageal reflux disease)    Gout    HIV (human immunodeficiency virus infection) (HCC) Dx 2005   IDA (iron deficiency anemia)    Schizophrenia (HCC)     Past Surgical History:  Procedure Laterality Date   CESAREAN SECTION     1994, 1996   Correction of hallux valgus by  double osteotomy, bilateral  2015   LUNG SURGERY     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    Family Psychiatric History:  denied  Family History:  Family History  Problem Relation Age of Onset   Diabetes Mother    Hypertension Mother    Diabetes Brother    Kidney failure Other     Social History:  Social History   Socioeconomic History   Marital status: Single    Spouse name: Not on file   Number of children: Not on file   Years of education: Not on file   Highest education level: Not on file  Occupational History   Not on file  Tobacco Use   Smoking status: Former    Pack years: 0.00    Types: Cigarettes   Smokeless tobacco: Never  Substance and Sexual Activity   Alcohol use: Never   Drug use: Yes    Types: Marijuana   Sexual activity: Not Currently  Other Topics Concern   Not on file  Social History Narrative   Not on file   Social Determinants of Health   Financial Resource Strain: Not on file  Food Insecurity: Not on file  Transportation Needs: Not on file  Physical Activity: Not on file  Stress: Not on file  Social Connections: Not on file    Allergies: No Known Allergies  Metabolic Disorder Labs: No results found for: HGBA1C, MPG No results found for: PROLACTIN No results found for: CHOL, TRIG, HDL, CHOLHDL, VLDL, LDLCALC No results found for: TSH  Therapeutic Level Labs: No results found for: LITHIUM No results found for: VALPROATE No components found for:  CBMZ  Current Medications: Current Outpatient Medications  Medication Sig Dispense Refill   hydrOXYzine (ATARAX/VISTARIL) 10 MG tablet Take 1 tablet (10 mg total) by mouth 3 (three) times daily as needed. 90 tablet 2   albuterol (VENTOLIN HFA) 108 (90 Base) MCG/ACT inhaler Inhale 1 puff into the lungs every 4 (four) hours as needed for wheezing or shortness of breath.     allopurinol (ZYLOPRIM) 100 MG tablet Take 100 mg by mouth daily.     beclomethasone (QVAR) 80 MCG/ACT inhaler Inhale 2  puffs into the lungs 2 (two) times daily. 1 each 3   celecoxib (CELEBREX) 100 MG capsule Take 1 capsule (100 mg total) by mouth 2 (two) times daily. 60 capsule 0   clonazePAM (KLONOPIN) 0.5 MG tablet Take 1 tablet (0.5 mg total) by mouth 2 (two) times daily as needed for anxiety. 60 tablet 1   Darunavir-Cobicisctat-Emtricitabine-Tenofovir Alafenamide (SYMTUZA) 800-150-200-10 MG TABS TAKE 1 TABLET BY MOUTH DAILY WITH BREAKFAST. 30 tablet 5   doxepin (SINEQUAN) 100 MG capsule Take 1 capsule (100 mg total) by mouth at bedtime. 30 capsule 2   hydrocortisone 2.5 % ointment Apply topically 2 (two) times daily.     montelukast (SINGULAIR) 10 MG tablet Take 1 tablet (10 mg total) by mouth at bedtime. 30 tablet 11   risperiDONE (RISPERDAL) 3 MG tablet Take 1  tablet (3 mg total) by mouth at bedtime. 30 tablet 2   sertraline (ZOLOFT) 100 MG tablet Take 1 tablet (100 mg total) by mouth daily with breakfast. 30 tablet 2   No current facility-administered medications for this visit.     Musculoskeletal: Strength & Muscle Tone:  Unable to assess due to telephone visit Gait & Station:   Unable to assess due to telephone visit Patient leans: N/A  Psychiatric Specialty Exam: Review of Systems  There were no vitals taken for this visit.There is no height or weight on file to calculate BMI.  General Appearance:   Unable to assess due to telephone visit  Eye Contact:    Unable to assess due to telephone visit  Speech:  Clear and Coherent and Normal Rate  Volume:  Normal  Mood:  Anxious and Depressed  Affect:  Appropriate and Congruent  Thought Process:  Coherent, Goal Directed, and Linear  Orientation:  Full (Time, Place, and Person)  Thought Content: Logical and Hallucinations: Auditory   Suicidal Thoughts:  No  Homicidal Thoughts:  No  Memory:  Immediate;   Good Recent;   Good Remote;   Good  Judgement:  Good  Insight:  Good  Psychomotor Activity:    Unable to assess due to telephone visit   Concentration:  Concentration: Good and Attention Span: Good  Recall:  Good  Fund of Knowledge: Good  Language: Good  Akathisia:  No  Handed:  Right  AIMS (if indicated): not done  Assets:  Communication Skills Desire for Improvement Financial Resources/Insurance Housing Leisure Time Social Support  ADL's:  Intact  Cognition: WNL  Sleep:  Poor   Screenings: GAD-7    Flowsheet Row Video Visit from 08/31/2020 in [facility] Office Visit from 06/02/2020 in [facility]  Total GAD-7 Score 19 19      PHQ2-9    Flowsheet Row Video Visit from 08/31/2020 in [facility] Office Visit from 07/03/2020 in [facility] Office Visit from 06/02/2020 in [facility] Office Visit from 03/31/2020 in [facility]  PHQ-2 Total Score 5 1 2 2   PHQ-9 Total Score 20 -- 14 --      Flowsheet Row ED from 04/28/2020 in [facility] EM[facility]ENCY [facility]PARTMENT  C-SSRS RISK CATEGORY No Risk        Assessment and Plan: Patient endorses symptoms of anxiety, depression, and psychosis.  At this time she is agreeable to increasing Risperdal 2 mg to 3 mg to help symptoms of psychosis.  She is also agreeable to increasing Zoloft 50 mg to 100 mg to help manage anxiety and depression.  She will start hydroxyzine 10 mg 3 times daily to help manage anxiety.  She will continue all other medications as prescribed.  1. Schizophrenia, paranoid (HCC)  Increased- sertraline (ZOLOFT) 100 MG tablet; Take 1 tablet (100 mg total) by mouth daily with breakfast.  Dispense: 30 tablet; Refill: 2 Increased- risperiDONE (RISPERDAL) 3 MG tablet; Take 1 tablet (3 mg total) by mouth at bedtime.  Dispense: 30 tablet; Refill: 2 Continue- doxepin (SINEQUAN) 100 MG capsule; Take 1 capsule (100 mg total) by mouth at bedtime.  Dispense: 30 capsule; Refill: 2  2.  Anxiety  Increased- sertraline (ZOLOFT) 100 MG tablet; Take 1 tablet (100 mg total) by mouth daily with breakfast.  Dispense: 30 tablet; Refill: 2 Continue- clonazePAM (KLONOPIN) 0.5 MG tablet; Take 1 tablet (  0.5 mg total) by mouth 2 (two) times daily as needed for anxiety.  Dispense: 60 tablet; Refill: 1 Start- hydrOXYzine (ATARAX/VISTARIL) 10 MG tablet; Take 1 tablet (10 mg total) by mouth 3 (three) times daily as needed.  Dispense: 90 tablet; Refill: 2  Follow up in 3 months  Shanna CiscoBrittney E Geral Coker, NP 08/31/2020, 11:46 AM

## 2020-09-02 ENCOUNTER — Ambulatory Visit (INDEPENDENT_AMBULATORY_CARE_PROVIDER_SITE_OTHER): Payer: Medicaid Other | Admitting: Student

## 2020-09-02 ENCOUNTER — Other Ambulatory Visit: Payer: Self-pay

## 2020-09-02 ENCOUNTER — Other Ambulatory Visit (HOSPITAL_COMMUNITY): Payer: Self-pay

## 2020-09-02 ENCOUNTER — Other Ambulatory Visit: Payer: Medicaid Other

## 2020-09-02 VITALS — BP 131/88 | HR 84 | Temp 97.7°F | Ht 63.0 in | Wt 174.1 lb

## 2020-09-02 DIAGNOSIS — M25571 Pain in right ankle and joints of right foot: Secondary | ICD-10-CM | POA: Diagnosis present

## 2020-09-02 DIAGNOSIS — M79672 Pain in left foot: Secondary | ICD-10-CM

## 2020-09-02 DIAGNOSIS — M79671 Pain in right foot: Secondary | ICD-10-CM

## 2020-09-02 DIAGNOSIS — Z21 Asymptomatic human immunodeficiency virus [HIV] infection status: Secondary | ICD-10-CM

## 2020-09-02 DIAGNOSIS — M25572 Pain in left ankle and joints of left foot: Secondary | ICD-10-CM | POA: Diagnosis not present

## 2020-09-02 DIAGNOSIS — R21 Rash and other nonspecific skin eruption: Secondary | ICD-10-CM

## 2020-09-02 MED ORDER — DICLOFENAC SODIUM 1 % EX GEL
4.0000 g | Freq: Four times a day (QID) | CUTANEOUS | 0 refills | Status: DC
  Filled 2020-09-02: qty 400, 25d supply, fill #0

## 2020-09-02 MED ORDER — DICLOFENAC SODIUM 1 % EX GEL: 4.0000 g | Freq: Four times a day (QID) | CUTANEOUS | 0 refills | Status: DC

## 2020-09-02 NOTE — Patient Instructions (Signed)
Thank you, [name] for allowing [place] to provide your care today. Today we discussed   Ankle pain and swelling Suspect that your ankle pain is secondary to your swelling and how this is making you walk.  I recommend compression stockings for now and I have ordered for Voltaren gel, which is a cream that can help with pain and inflammation in your ankles.  I have also ordered bilateral ankle x-rays.  I would like you to schedule appointment in 2 weeks to come back and see me after your ankle x-rays are done.  I have ordered the following labs for you:  Lab Orders  No laboratory test(s) ordered today     Tests ordered today:  Bilateral ankle x-rays  Referrals ordered today:   Referral Orders  No referral(s) requested today     I have ordered the following medication/changed the following medications:   Stop the following medications: Medications Discontinued During This Encounter  Medication Reason   celecoxib (CELEBREX) 100 MG capsule      Start the following medications: Meds ordered this encounter  Medications   diclofenac Sodium (VOLTAREN) 1 % GEL    Sig: Apply 4 g topically 4 (four) times daily.    Dispense:  350 g    Refill:  0     Follow up:  Last week of July for follow-up visit concerning her bilateral ankle pain      Should you have any questions or concerns please call the internal medicine clinic at [phone].     Thalia Bloodgood, D.O. [facility]

## 2020-09-02 NOTE — Progress Notes (Signed)
CC: Chronic rash, bilateral ankle pain  HPI:  [name] is a 55 y.o. female with a past medical history stated below and presents today for bilateral ankle pain, assessment of her chronic rash of her lower extremities and back. Please see problem based assessment and plan for additional details.  Past Medical History:  Diagnosis Date   GAD (generalized anxiety disorder)    GERD (gastroesophageal reflux disease)    Gout    HIV (human immunodeficiency virus infection) (HCC) Dx 2005   IDA (iron deficiency anemia)    Schizophrenia (HCC)     Current Outpatient Medications on File Prior to Visit  Medication Sig Dispense Refill   albuterol (VENTOLIN HFA) 108 (90 Base) MCG/ACT inhaler Inhale 1 puff into the lungs every 4 (four) hours as needed for wheezing or shortness of breath.     allopurinol (ZYLOPRIM) 100 MG tablet Take 100 mg by mouth daily.     beclomethasone (QVAR) 80 MCG/ACT inhaler Inhale 2 puffs into the lungs 2 (two) times daily. 1 each 3   clonazePAM (KLONOPIN) 0.5 MG tablet Take 1 tablet (0.5 mg total) by mouth 2 (two) times daily as needed for anxiety. 60 tablet 1   Darunavir-Cobicisctat-Emtricitabine-Tenofovir Alafenamide (SYMTUZA) 800-150-200-10 MG TABS TAKE 1 TABLET BY MOUTH DAILY WITH BREAKFAST. 30 tablet 5   doxepin (SINEQUAN) 100 MG capsule Take 1 capsule (100 mg total) by mouth at bedtime. 30 capsule 2   hydrocortisone 2.5 % ointment Apply topically 2 (two) times daily.     hydrOXYzine (ATARAX/VISTARIL) 10 MG tablet Take 1 tablet (10 mg total) by mouth 3 (three) times daily as needed. 90 tablet 2   montelukast (SINGULAIR) 10 MG tablet Take 1 tablet (10 mg total) by mouth at bedtime. 30 tablet 11   risperiDONE (RISPERDAL) 3 MG tablet Take 1 tablet (3 mg total) by mouth at bedtime. 30 tablet 2   sertraline (ZOLOFT) 100 MG tablet Take 1 tablet (100 mg total) by mouth daily with breakfast. 30 tablet 2   No current facility-administered medications on file prior to  visit.    Family History  Problem Relation Age of Onset   Diabetes Mother    Hypertension Mother    Diabetes Brother    Kidney failure Other     Social History   Socioeconomic History   Marital status: Single    Spouse name: Not on file   Number of children: Not on file   Years of education: Not on file   Highest education level: Not on file  Occupational History   Not on file  Tobacco Use   Smoking status: Former    Types: Cigarettes   Smokeless tobacco: Never  Substance and Sexual Activity   Alcohol use: Never   Drug use: Yes    Types: Marijuana   Sexual activity: Not Currently  Other Topics Concern   Not on file  Social History Narrative   Not on file   Social Determinants of Health   Financial Resource Strain: Not on file  Food Insecurity: Not on file  Transportation Needs: Not on file  Physical Activity: Not on file  Stress: Not on file  Social Connections: Not on file  Intimate Partner Violence: Not on file    Review of Systems: ROS negative except for what is noted on the assessment and plan.  Vitals:   09/02/20 0922  BP: 131/88  Pulse: 84  Temp: 97.7 F (36.5 C)  SpO2: 98%  Weight: 174 lb 1.6 oz (79  kg)  Height: 5\' 3"  (1.6 m)   Physical Exam: Constitutional: Well-appearing, no acute distress HENT: normocephalic atraumatic Eyes: conjunctiva non-erythematous Neck: supple Cardiovascular: regular rate and rhythm, no m/r/g. Pulmonary/Chest: normal work of breathing on room air MSK: normal bulk and tone.  Bilateral ankle edema, 1-2+.  Bilateral lateral malleoli tender to palpate Neurological: alert & oriented x 3 Skin: warm and dry.  Multiple hyperpigmented patches on dorsal aspect of patient's foot and ankle as well as lower back. Psych: Normal mood and thought process      Assessment & Plan:   See Encounters Tab for problem based charting.  Patient seen with Dr. , D.O. [facility],  PGY-2 Pager: [phone], Phone: [phone] Date 09/03/2020 Time 7:42 PM

## 2020-09-03 ENCOUNTER — Other Ambulatory Visit (HOSPITAL_COMMUNITY): Payer: Self-pay

## 2020-09-03 LAB — T-HELPER CELL (CD4) - (RCID CLINIC ONLY)
CD4 % Helper T Cell: 36 % (ref 33–65)
CD4 T Cell Abs: 405 /uL (ref 400–1790)

## 2020-09-03 NOTE — Assessment & Plan Note (Signed)
Assessment: Patient states she has had a chronic rash on her feet as well as lower back and chest for the past year.  She developed hard blisters on her feet that developed into a pruritic rash that started about a year ago in [place] after having what she believes was a bug bite on her feet.  Since, the patient denies new blisters developing but states that she has had hyperpigmented patches of on her feet and lower back.  She was seen by dermatology in [place] prior to moving back to [place] and  treated for fungal infection and with topical steroids with little benefit.  She denies biopsy being performed as there were no "fresh" lesions.  I am unsure the etiology of her rash, however I do not believe she is having recurrent lesions appear and that the current hyperpigmented patches are a result of scarring from the blistering.  I do not believe topical treatment will resolve with him.  I also do not believe that a biopsy is warranted as there are no fresh lesions.  Plan: -Continue to monitor and biopsy if new blistering lesions appear

## 2020-09-03 NOTE — Assessment & Plan Note (Addendum)
Assessment: Patient endorses bilateral ankle pain and swelling causing her to have ankle inversion and walk on the lateral portions of her foot.  I suspect that this is a cycle of swelling causing inversion leading to patient having ligament strain and leading her to have pain.  She has exquisite tenderness to her lateral malleoli when palpated.  She denies trauma to the area.  She denies noticing any triggering factors that led to the swelling.  She notes that the swelling has not occurred for the past few months until recently it reoccurred.   I do not believe that the pain and swelling in her feet are related to her chronic rash.  Patient's lower extremities are not cool to touch and she has strong, palpable pulses. Low suspicion that this is due to CHF, venous insufficiency, or PAD.  Will order complete bilateral ankle x-rays.  Also recommended she wear compression stockings and elevate her feet in the evening times.  We will follow-up with the patient in 2 weeks and review her x-rays as well as assess her lower extremity swelling.  If no improvement patient may need referral to podiatry  Plan: -Complete x-ray bilateral ankles -Compression stockings -Follow-up in 2 weeks  Addendum:  Right complete ankle x-ray with evidence of old fracture of the distal fibula, left complete ankle x-ray no discrete fracture.  Curvilinear density at the lateral talus.  I believe patient would benefit from podiatry referral, we will place that today and call patient and update her the plan.

## 2020-09-04 ENCOUNTER — Ambulatory Visit
Admission: RE | Admit: 2020-09-04 | Discharge: 2020-09-04 | Disposition: A | Discharge status: Home / Self Care | Payer: Medicaid Other | Source: Ambulatory Visit | Attending: Internal Medicine | Admitting: Internal Medicine

## 2020-09-04 ENCOUNTER — Other Ambulatory Visit: Payer: Medicaid Other

## 2020-09-04 DIAGNOSIS — M25571 Pain in right ankle and joints of right foot: Secondary | ICD-10-CM

## 2020-09-05 LAB — HIV-1 RNA QUANT-NO REFLEX-BLD
HIV 1 RNA Quant: NOT DETECTED Copies/mL
HIV-1 RNA Quant, Log: NOT DETECTED Log cps/mL

## 2020-09-07 NOTE — Addendum Note (Signed)
Addended by: Belva Agee on: 09/07/2020 05:46 PM   Modules accepted: Orders

## 2020-09-12 NOTE — Progress Notes (Signed)
Internal Medicine Clinic Attending  I saw and evaluated the patient.  I personally confirmed the key portions of the history and exam documented by Dr. Evie Lacks and I reviewed pertinent patient test results.  The assessment, diagnosis, and plan were formulated together and I agree with the documentation in the resident's note.  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

## 2020-09-18 ENCOUNTER — Encounter: Payer: Medicaid Other | Admitting: Infectious Diseases

## 2020-09-23 ENCOUNTER — Other Ambulatory Visit (HOSPITAL_COMMUNITY): Payer: Self-pay

## 2020-09-28 ENCOUNTER — Other Ambulatory Visit (HOSPITAL_COMMUNITY): Payer: Self-pay

## 2020-10-19 ENCOUNTER — Ambulatory Visit: Payer: Medicaid Other | Admitting: Infectious Diseases

## 2020-10-21 ENCOUNTER — Other Ambulatory Visit (HOSPITAL_COMMUNITY): Payer: Self-pay

## 2020-10-23 ENCOUNTER — Other Ambulatory Visit (HOSPITAL_COMMUNITY): Payer: Self-pay

## 2020-10-27 ENCOUNTER — Other Ambulatory Visit (HOSPITAL_COMMUNITY): Payer: Self-pay

## 2020-11-10 ENCOUNTER — Ambulatory Visit: Payer: Medicaid Other | Admitting: Infectious Diseases

## 2020-11-11 ENCOUNTER — Other Ambulatory Visit (HOSPITAL_COMMUNITY): Payer: Self-pay

## 2020-11-11 ENCOUNTER — Other Ambulatory Visit: Payer: Self-pay | Admitting: Infectious Diseases

## 2020-11-11 DIAGNOSIS — B20 Human immunodeficiency virus [HIV] disease: Secondary | ICD-10-CM

## 2020-11-11 MED ORDER — SYMTUZA 800-150-200-10 MG PO TABS
1.0000 | ORAL_TABLET | Freq: Every day | ORAL | 6 refills | Status: DC
  Filled 2020-11-11 – 2020-11-19 (×2): qty 30, 30d supply, fill #0
  Filled 2020-12-17: qty 30, 30d supply, fill #1
  Filled 2021-01-18: qty 30, 30d supply, fill #2
  Filled 2021-02-08: qty 30, 30d supply, fill #3
  Filled 2021-03-09: qty 30, 30d supply, fill #4
  Filled 2021-03-31: qty 30, 30d supply, fill #5
  Filled 2021-04-30: qty 30, 30d supply, fill #6

## 2020-11-11 NOTE — Progress Notes (Signed)
Patient had to reschedule d/t COVID infection.  Refills sent in - working with Mitch to reschedule routine OV for next week.

## 2020-11-19 ENCOUNTER — Other Ambulatory Visit (HOSPITAL_COMMUNITY): Payer: Self-pay

## 2020-11-20 ENCOUNTER — Encounter: Payer: Self-pay | Admitting: Infectious Diseases

## 2020-11-20 ENCOUNTER — Other Ambulatory Visit (HOSPITAL_COMMUNITY): Payer: Self-pay

## 2020-11-20 ENCOUNTER — Other Ambulatory Visit: Payer: Self-pay

## 2020-11-20 ENCOUNTER — Ambulatory Visit (INDEPENDENT_AMBULATORY_CARE_PROVIDER_SITE_OTHER): Payer: Medicaid Other | Admitting: Infectious Diseases

## 2020-11-20 VITALS — BP 116/87 | HR 108 | Temp 97.6°F | Wt 166.0 lb

## 2020-11-20 DIAGNOSIS — F200 Paranoid schizophrenia: Secondary | ICD-10-CM | POA: Diagnosis not present

## 2020-11-20 DIAGNOSIS — M79671 Pain in right foot: Secondary | ICD-10-CM

## 2020-11-20 DIAGNOSIS — M79672 Pain in left foot: Secondary | ICD-10-CM

## 2020-11-20 DIAGNOSIS — Z21 Asymptomatic human immunodeficiency virus [HIV] infection status: Secondary | ICD-10-CM | POA: Diagnosis not present

## 2020-11-20 MED ORDER — DICLOFENAC SODIUM 1 % EX GEL
4.0000 g | Freq: Four times a day (QID) | CUTANEOUS | 4 refills | Status: DC
Start: 2020-11-20 — End: 2021-08-20
  Filled 2020-11-20: qty 400, 25d supply, fill #0

## 2020-11-20 NOTE — Assessment & Plan Note (Signed)
Foot pain and knee pain - she had trouble from sitting to standing today but once she was able to get up she seemed to be moving well.  She is not interested in referral out to podiatry/sports medicine as she does not have the money to afford the visits and transportation. Will refill diclofenac cream (though not clear to me this will be covered) for QID use for 5 consecutive days to help her with flares.  Encouraged her to work with her PCP for any more recommendations for cares.

## 2020-11-20 NOTE — Assessment & Plan Note (Signed)
Anxious today but overall schizophrenia symptoms are well controlled. She likes her mental health team and has FU with them soon.

## 2020-11-20 NOTE — Patient Instructions (Addendum)
It was nice to see you -   I will send in refills for your cream.   Will see you back in 9 months for routine check up.

## 2020-11-20 NOTE — Assessment & Plan Note (Addendum)
Anne York is doing well on Symtuza once daily. Will update VL, CD4, CMP, CBC, RPR. Refills provided and she will return in 9 months for routine follow up for HIV care.  Declined vaccinations today.   HIV 1 RNA Quant (Copies/mL)  Date Value  09/02/2020 Not Detected  03/31/2020 <20   CD4 T Cell Abs (/uL)  Date Value  09/02/2020 405  03/31/2020 457

## 2020-11-20 NOTE — Progress Notes (Signed)
Name: Anne York  DOB: 24-Mar-1965 MRN: [id number] PCP: Gaylan Gerold, DO  Pulmonologist: Dr. Lamonte Sakai    Brief Narrative:  Anne York is a 55 y.o. female with HIV dx 2005 in [place] at [facility].  CD4 nadir < 200  HIV Risk: heterosexual  History of OIs: none Intake Labs 2022: Hep B sAg (-), sAb (+), cAb (-); Hep A (+), Hep C (-) Quantiferon (-) HLA B*5701 (-) G6PD: ()   Previous Regimens: 2013 - Prezista + Norvir + Truvada  Symtuza   Genotypes: 2014 - K103N noted     Subjective:   Chief Complaint  Patient presents with   Follow-up    B20       HPI: Anne York is very nervous about being here today with the weather and life events involving her son. She has an appointment with her mental health team soon and finds them helpful for her.   She is taking her Symtuza everyday without missed doses.  Aside from anxiety her main concern today is ankle and knee pain. She is requesting a refill on the topical cream I gave her in the past as this has worked well for her but did not realized it needed to be used multiple times a day.   Recovered from [place] again and was concerned about how long she was testing positive (with pcr).    Review of Systems  Constitutional:  Negative for appetite change, chills, fatigue, fever and unexpected weight change.  HENT:  Negative for mouth sores, sore throat and trouble swallowing.   Eyes:  Negative for pain and visual disturbance.  Respiratory:  Negative for cough and shortness of breath.   Cardiovascular:  Negative for chest pain.  Gastrointestinal:  Positive for abdominal pain, blood in stool and constipation. Negative for diarrhea and nausea.  Genitourinary:  Negative for dysuria, menstrual problem and pelvic pain.  Musculoskeletal:  Positive for arthralgias. Negative for back pain, joint swelling and neck pain.  Skin:  Positive for color change and rash.  Neurological:  Negative for weakness, numbness and headaches.   Hematological:  Negative for adenopathy.  Psychiatric/Behavioral:  Negative for dysphoric mood. The patient is not nervous/anxious.     Past Medical History:  Diagnosis Date   GAD (generalized anxiety disorder)    GERD (gastroesophageal reflux disease)    Gout    HIV (human immunodeficiency virus infection) ([place]) Dx 2005   IDA (iron deficiency anemia)    Schizophrenia ([place])     Outpatient Medications Prior to Visit  Medication Sig Dispense Refill   albuterol (VENTOLIN HFA) 108 (90 Base) MCG/ACT inhaler Inhale 1 puff into the lungs every 4 (four) hours as needed for wheezing or shortness of breath.     allopurinol (ZYLOPRIM) 100 MG tablet Take 100 mg by mouth daily.     beclomethasone (QVAR) 80 MCG/ACT inhaler Inhale 2 puffs into the lungs 2 (two) times daily. 1 each 3   clonazePAM (KLONOPIN) 0.5 MG tablet Take 1 tablet (0.5 mg total) by mouth 2 (two) times daily as needed for anxiety. 60 tablet 1   Darunavir-Cobicistat-Emtricitabine-Tenofovir Alafenamide (SYMTUZA) 800-150-200-10 MG TABS TAKE 1 TABLET BY MOUTH DAILY WITH BREAKFAST. 30 tablet 6   doxepin (SINEQUAN) 100 MG capsule Take 1 capsule (100 mg total) by mouth at bedtime. 30 capsule 2   hydrocortisone 2.5 % ointment Apply topically 2 (two) times daily.     hydrOXYzine (ATARAX/VISTARIL) 10 MG tablet Take 1 tablet (10 mg total) by mouth 3 (three) times  daily as needed. 90 tablet 2   montelukast (SINGULAIR) 10 MG tablet Take 1 tablet (10 mg total) by mouth at bedtime. 30 tablet 11   risperiDONE (RISPERDAL) 3 MG tablet Take 1 tablet (3 mg total) by mouth at bedtime. 30 tablet 2   sertraline (ZOLOFT) 100 MG tablet Take 1 tablet (100 mg total) by mouth daily with breakfast. 30 tablet 2   diclofenac Sodium (VOLTAREN) 1 % GEL Apply 4 g topically 4 (four) times daily. 400 g 0   No facility-administered medications prior to visit.     No Known Allergies  Social History   Tobacco Use   Smoking status: Former    Types: Cigarettes    Smokeless tobacco: Never  Substance Use Topics   Alcohol use: Never   Drug use: Yes    Types: Marijuana    Family History  Problem Relation Age of Onset   Diabetes Mother    Hypertension Mother    Diabetes Brother    Kidney failure Other     Social History   Substance and Sexual Activity  Sexual Activity Not Currently     Objective:   Vitals:   11/20/20 0839  BP: 116/87  Pulse: (!) 108  Temp: 97.6 F (36.4 C)  TempSrc: Oral  SpO2: 97%  Weight: 166 lb (75.3 kg)   Body mass index is 29.41 kg/m.  Physical Exam HENT:     Mouth/Throat:     Mouth: No oral lesions.     Dentition: No dental abscesses.  Cardiovascular:     Rate and Rhythm: Normal rate and regular rhythm.     Heart sounds: Normal heart sounds.  Pulmonary:     Effort: Pulmonary effort is normal.     Breath sounds: Rhonchi present.  Abdominal:     General: There is no distension.     Palpations: Abdomen is soft.     Tenderness: There is no abdominal tenderness.  Musculoskeletal:        General: No tenderness. Normal range of motion.  Lymphadenopathy:     Cervical: No cervical adenopathy.  Skin:    General: Skin is warm and dry.     Findings: Rash (flat macular hyperpigmented rash that is unchanged at the base of her back. ) present.  Neurological:     Mental Status: She is alert and oriented to person, place, and time.  Psychiatric:        Judgment: Judgment normal.    Lab Results Lab Results  Component Value Date   WBC 4.0 03/31/2020   HGB 12.8 03/31/2020   HCT 37.9 03/31/2020   MCV 98.4 03/31/2020   PLT 199 03/31/2020    Lab Results  Component Value Date   CREATININE 0.84 03/31/2020   BUN 25 03/31/2020   NA 140 03/31/2020   K 4.4 03/31/2020   CL 109 03/31/2020   CO2 23 03/31/2020    Lab Results  Component Value Date   ALT 15 03/31/2020   AST 26 03/31/2020   BILITOT 0.3 03/31/2020    No results found for: CHOL, HDL, LDLCALC, LDLDIRECT, TRIG, CHOLHDL HIV 1 RNA Quant  (Copies/mL)  Date Value  09/02/2020 Not Detected  03/31/2020 <20   CD4 T Cell Abs (/uL)  Date Value  09/02/2020 405  03/31/2020 457     Assessment & Plan:   Problem List Items Addressed This Visit       Unprioritized   Schizophrenia, paranoid ([place])    Anxious today but overall schizophrenia symptoms  are well controlled. She likes her mental health team and has FU with them soon.       Pain in both feet    Foot pain and knee pain - she had trouble from sitting to standing today but once she was able to get up she seemed to be moving well.  She is not interested in referral out to podiatry/sports medicine as she does not have the money to afford the visits and transportation. Will refill diclofenac cream (though not clear to me this will be covered) for QID use for 5 consecutive days to help her with flares.  Encouraged her to work with her PCP for any more recommendations for cares.       HIV (human immunodeficiency virus infection) ([place]) - Primary    Anne York is doing well on Symtuza once daily. Will update VL, CD4, CMP, CBC, RPR. Refills provided and she will return in 9 months for routine follow up for HIV care.  Declined vaccinations today.   HIV 1 RNA Quant (Copies/mL)  Date Value  09/02/2020 Not Detected  03/31/2020 <20   CD4 T Cell Abs (/uL)  Date Value  09/02/2020 405  03/31/2020 457         Relevant Orders   HIV-1 RNA quant-no reflex-bld   RPR   COMPLETE METABOLIC PANEL WITH GFR   CBC with Differential/Platelet   T-helper cells (CD4) count   Anne Madeira, MSN, NP-C [facility] [place] Pager: [phone] Office: [phone]  11/20/20  9:17 AM

## 2020-11-23 ENCOUNTER — Other Ambulatory Visit (HOSPITAL_COMMUNITY): Payer: Self-pay

## 2020-11-23 LAB — CBC WITH DIFFERENTIAL/PLATELET
Absolute Monocytes: 302 cells/uL (ref 200–950)
Basophils Absolute: 18 cells/uL (ref 0–200)
Basophils Relative: 0.4 %
Eosinophils Absolute: 72 cells/uL (ref 15–500)
Eosinophils Relative: 1.6 %
HCT: 41.1 % (ref 35.0–45.0)
Hemoglobin: 13.7 g/dL (ref 11.7–15.5)
Lymphs Abs: 1197 cells/uL (ref 850–3900)
MCH: 33 pg (ref 27.0–33.0)
MCHC: 33.3 g/dL (ref 32.0–36.0)
MCV: 99 fL (ref 80.0–100.0)
MPV: 10.9 fL (ref 7.5–12.5)
Monocytes Relative: 6.7 %
Neutro Abs: 2912 cells/uL (ref 1500–7800)
Neutrophils Relative %: 64.7 %
Platelets: 197 10*3/uL (ref 140–400)
RBC: 4.15 10*6/uL (ref 3.80–5.10)
RDW: 13 % (ref 11.0–15.0)
Total Lymphocyte: 26.6 %
WBC: 4.5 10*3/uL (ref 3.8–10.8)

## 2020-11-23 LAB — COMPLETE METABOLIC PANEL WITH GFR
AG Ratio: 1.5 (calc) (ref 1.0–2.5)
ALT: 17 U/L (ref 6–29)
AST: 75 U/L — ABNORMAL HIGH (ref 10–35)
Albumin: 3.8 g/dL (ref 3.6–5.1)
Alkaline phosphatase (APISO): 79 U/L (ref 37–153)
BUN: 11 mg/dL (ref 7–25)
CO2: 23 mmol/L (ref 20–32)
Calcium: 9.1 mg/dL (ref 8.6–10.4)
Chloride: 107 mmol/L (ref 98–110)
Creat: 0.84 mg/dL (ref 0.50–1.03)
Globulin: 2.6 g/dL (calc) (ref 1.9–3.7)
Glucose, Bld: 90 mg/dL (ref 65–99)
Potassium: 3.4 mmol/L — ABNORMAL LOW (ref 3.5–5.3)
Sodium: 141 mmol/L (ref 135–146)
Total Bilirubin: 0.4 mg/dL (ref 0.2–1.2)
Total Protein: 6.4 g/dL (ref 6.1–8.1)
eGFR: 83 mL/min/{1.73_m2} (ref >=60)

## 2020-11-23 LAB — HIV-1 RNA QUANT-NO REFLEX-BLD
HIV 1 RNA Quant: NOT DETECTED Copies/mL
HIV-1 RNA Quant, Log: NOT DETECTED Log cps/mL

## 2020-11-23 LAB — RPR: RPR Ser Ql: NONREACTIVE

## 2020-11-23 LAB — T-HELPER CELLS (CD4) COUNT (NOT AT ARMC)
Absolute CD4: 484 cells/uL — ABNORMAL LOW (ref 490–1740)
CD4 T Helper %: 37 % (ref 30–61)
Total lymphocyte count: 1305 cells/uL (ref 850–3900)

## 2020-11-26 ENCOUNTER — Telehealth: Payer: Self-pay

## 2020-11-26 NOTE — Telephone Encounter (Signed)
Called patient to relay results, no answer. Left HIPAA compliant voicemail requesting callback.   Deandria Klute D Ravon Mcilhenny, RN  

## 2020-11-26 NOTE — Telephone Encounter (Signed)
-----   Message from Blanchard Kelch, NP sent at 11/26/2020  1:24 PM EDT ----- Please let Anne York know that her medicaiton is working well - continue symtuza everyday.  Viral load is not detected  CD4 is 484  Her liver function tests are just a little elevated showing a small amount of inflammation. This seems new for her. I would recommend she talk with her primary care team about this further and help monitor for her.   Thank you

## 2020-11-27 NOTE — Telephone Encounter (Signed)
Spoke with patient, relayed that her viral load is undetectable and CD4 count is in the high 400s and to continue with Symtuza daily. Relayed that her liver function tests are slightly elevated and that Judeth Cornfield would like her to reach out to her PCP to discuss next steps and further monitoring. Patient verbalized understanding and has no further questions.   Sandie Ano, RN

## 2020-12-01 ENCOUNTER — Other Ambulatory Visit: Payer: Self-pay

## 2020-12-01 ENCOUNTER — Other Ambulatory Visit (HOSPITAL_COMMUNITY): Payer: Self-pay

## 2020-12-01 ENCOUNTER — Telehealth (INDEPENDENT_AMBULATORY_CARE_PROVIDER_SITE_OTHER): Payer: Medicaid Other | Admitting: Psychiatry

## 2020-12-01 ENCOUNTER — Encounter (HOSPITAL_COMMUNITY): Payer: Self-pay | Admitting: Psychiatry

## 2020-12-01 DIAGNOSIS — F419 Anxiety disorder, unspecified: Secondary | ICD-10-CM | POA: Diagnosis not present

## 2020-12-01 DIAGNOSIS — F200 Paranoid schizophrenia: Secondary | ICD-10-CM

## 2020-12-01 MED ORDER — RISPERIDONE 3 MG PO TABS
3.0000 mg | ORAL_TABLET | Freq: Every day | ORAL | 3 refills | Status: DC
Start: 2020-12-01 — End: 2021-03-03
  Filled 2020-12-01: qty 30, 30d supply, fill #0
  Filled 2021-01-11: qty 30, 30d supply, fill #1

## 2020-12-01 MED ORDER — CLONAZEPAM 0.5 MG PO TABS
0.5000 mg | ORAL_TABLET | Freq: Two times a day (BID) | ORAL | 2 refills | Status: DC | PRN
  Filled 2020-12-01: qty 60, 30d supply, fill #0
  Filled 2021-01-11: qty 60, 30d supply, fill #1

## 2020-12-01 MED ORDER — SERTRALINE HCL 100 MG PO TABS
100.0000 mg | ORAL_TABLET | Freq: Every day | ORAL | 3 refills | Status: DC
  Filled 2020-12-01: qty 30, 30d supply, fill #0

## 2020-12-01 MED ORDER — DOXEPIN HCL 100 MG PO CAPS
100.0000 mg | ORAL_CAPSULE | Freq: Every day | ORAL | 3 refills | Status: DC
  Filled 2020-12-01: qty 30, 30d supply, fill #0
  Filled 2021-01-11: qty 30, 30d supply, fill #1

## 2020-12-01 MED ORDER — GABAPENTIN 300 MG PO CAPS
300.0000 mg | ORAL_CAPSULE | Freq: Three times a day (TID) | ORAL | 3 refills | Status: DC
  Filled 2020-12-01: qty 90, 30d supply, fill #0
  Filled 2021-01-11: qty 90, 30d supply, fill #1

## 2020-12-01 MED ORDER — HYDROXYZINE HCL 10 MG PO TABS
10.0000 mg | ORAL_TABLET | Freq: Three times a day (TID) | ORAL | 3 refills | Status: DC | PRN
  Filled 2020-12-01: qty 90, 30d supply, fill #0
  Filled 2021-01-11: qty 90, 30d supply, fill #1

## 2020-12-01 NOTE — Progress Notes (Signed)
BH MD/PA/NP OP Progress Note Virtual Visit via Telephone Note  I connected with Anne York on 12/01/20 at  3:00 PM EDT by telephone and verified that I am speaking with the correct person using two identifiers.  Location: Patient: home Provider: Clinic   I discussed the limitations, risks, security and privacy concerns of performing an evaluation and management service by telephone and the availability of in person appointments. I also discussed with the patient that there may be a patient responsible charge related to this service. The patient expressed understanding and agreed to proceed.   I provided 30 minutes of non-face-to-face time during this encounter.  12/01/2020 3:36 PM Anne York  MRN:  [id number]  Chief Complaint: "I'm struggling. My son has been in an accident"  HPI: 55 year old female seen today for follow-up psychiatric evaluation.  She has a psychiatric history of schizophrenia, anxiety, and depression.  She is currently managed on Klonopin 0.5 mg twice daily, Risperdal 3 mg nightly, Zoloft 50 mg daily, hydroxyzine 10 mg three times daily as needed, doxepin 100 mg nightly.  She notes medications are somewhat effective in managing her psychiatric conditions.  Today she is pleasant, cooperative, and engaged in conversation.  Patient was unable to logon virtually for assistance on the phone.  During exam she notes that she has been struggling. She notes that recently her son was in a car accident. He is now hospitalized and has a collapsed lungs, broken jaw, broken wrist, and a metal rod placed in his legs.  She  notes that she is concerned about his life and requested that Klonopin be increased.  She notes that in the past are higher dose of Klonopin to help managed her anxiety.  Patient informed that Klonopin would not be increased at this time.  She endorsed understanding and agreed.  She notes that her sons girlfriend is 6 months pregnant and notes that she want to be  mentally stable so that she can be there for him. Recently she notes that she has  been more anxious and isolating. She notes that she stays home most days and declines going out with friends due to her mental instability. She notes that her desire is to go outside and be productive so that she can fully enjoy life. She reports that her service dog helps manage some of her anxiety/depression.  Today provider conducted a GAD-7 and patient scored a 19, at her last visit she scored a 19. Provider also conducted a PHQ-9 and patient scored a 20, at her last visit she scored a 20. She notes that the increase in Risperdal has reduced her AH however notes that they are still present.  She describes the [facility] as voices of self doubt).  She notes that hers sleep continues to be poor (noting that she sleeps 3 hours nightly). She also endorses having a poor adequate appetite with recent weight loss of 10 pounds.  Patient notes that she has arthritis and has knee pain often.   Today she is agreeable to starting gabapentin 300 mg three times daily to help manage anxiety and pain. Potential side effects of medication and risks vs benefits of treatment vs non-treatment were explained and discussed. All questions were answered.  No other concerns noted at this time.  Visit Diagnosis:    ICD-10-CM   1. Anxiety  F41.9 clonazePAM (KLONOPIN) 0.5 MG tablet    hydrOXYzine (ATARAX/VISTARIL) 10 MG tablet    sertraline (ZOLOFT) 100 MG tablet    gabapentin (NEURONTIN) 300  MG capsule    2. Schizophrenia, paranoid (HCC)  F20.0 doxepin (SINEQUAN) 100 MG capsule    risperiDONE (RISPERDAL) 3 MG tablet    sertraline (ZOLOFT) 100 MG tablet      Past Psychiatric History:  Schizophrenia, anxiety, depression    Past Medical History:  Past Medical History:  Diagnosis Date   GAD (generalized anxiety disorder)    GERD (gastroesophageal reflux disease)    Gout    HIV (human immunodeficiency virus infection) (HCC) Dx 2005   IDA  (iron deficiency anemia)    Schizophrenia (HCC)     Past Surgical History:  Procedure Laterality Date   CESAREAN SECTION     1994, 1996   Correction of hallux valgus by double osteotomy, bilateral  2015   LUNG SURGERY     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    Family Psychiatric History:  denied  Family History:  Family History  Problem Relation Age of Onset   Diabetes Mother    Hypertension Mother    Diabetes Brother    Kidney failure Other     Social History:  Social History   Socioeconomic History   Marital status: Single    Spouse name: Not on file   Number of children: Not on file   Years of education: Not on file   Highest education level: Not on file  Occupational History   Not on file  Tobacco Use   Smoking status: Former    Types: Cigarettes   Smokeless tobacco: Never  Substance and Sexual Activity   Alcohol use: Never   Drug use: Yes    Types: Marijuana   Sexual activity: Not Currently  Other Topics Concern   Not on file  Social History Narrative   Not on file   Social Determinants of Health   Financial Resource Strain: Not on file  Food Insecurity: Not on file  Transportation Needs: Not on file  Physical Activity: Not on file  Stress: Not on file  Social Connections: Not on file    Allergies: No Known Allergies  Metabolic Disorder Labs: No results found for: HGBA1C, MPG No results found for: PROLACTIN No results found for: CHOL, TRIG, HDL, CHOLHDL, VLDL, LDLCALC No results found for: TSH  Therapeutic Level Labs: No results found for: LITHIUM No results found for: VALPROATE No components found for:  CBMZ  Current Medications: Current Outpatient Medications  Medication Sig Dispense Refill   gabapentin (NEURONTIN) 300 MG capsule Take 1 capsule (300 mg total) by mouth 3 (three) times daily. 90 capsule 3   albuterol (VENTOLIN HFA) 108 (90 Base) MCG/ACT inhaler Inhale 1 puff into the lungs every 4 (four) hours as needed for wheezing or  shortness of breath.     allopurinol (ZYLOPRIM) 100 MG tablet Take 100 mg by mouth daily.     beclomethasone (QVAR) 80 MCG/ACT inhaler Inhale 2 puffs into the lungs 2 (two) times daily. 1 each 3   clonazePAM (KLONOPIN) 0.5 MG tablet Take 1 tablet (0.5 mg total) by mouth 2 (two) times daily as needed for anxiety. 60 tablet 2   Darunavir-Cobicistat-Emtricitabine-Tenofovir Alafenamide (SYMTUZA) 800-150-200-10 MG TABS TAKE 1 TABLET BY MOUTH DAILY WITH BREAKFAST. 30 tablet 6   diclofenac Sodium (VOLTAREN) 1 % GEL Apply 4 grams to affected area 4 (four) times daily. 400 g 4   doxepin (SINEQUAN) 100 MG capsule Take 1 capsule (100 mg total) by mouth at bedtime. 30 capsule 3   hydrocortisone 2.5 % ointment Apply topically 2 (two) times  daily.     hydrOXYzine (ATARAX/VISTARIL) 10 MG tablet Take 1 tablet (10 mg total) by mouth 3 (three) times daily as needed. 90 tablet 3   montelukast (SINGULAIR) 10 MG tablet Take 1 tablet (10 mg total) by mouth at bedtime. 30 tablet 11   risperiDONE (RISPERDAL) 3 MG tablet Take 1 tablet (3 mg total) by mouth at bedtime. 30 tablet 3   sertraline (ZOLOFT) 100 MG tablet Take 1 tablet (100 mg total) by mouth daily with breakfast. 30 tablet 3   No current facility-administered medications for this visit.     Musculoskeletal: Strength & Muscle Tone:  Unable to assess due to telephone visit Gait & Station:   Unable to assess due to telephone visit Patient leans: N/A  Psychiatric Specialty Exam: Review of Systems  There were no vitals taken for this visit.There is no height or weight on file to calculate BMI.  General Appearance:   Unable to assess due to telephone visit  Eye Contact:    Unable to assess due to telephone visit  Speech:  Clear and Coherent and Normal Rate  Volume:  Normal  Mood:  Anxious and Depressed  Affect:  Appropriate and Congruent  Thought Process:  Coherent, Goal Directed, and Linear  Orientation:  Full (Time, Place, and Person)  Thought  Content: Logical and Hallucinations: Auditory   Suicidal Thoughts:  No  Homicidal Thoughts:  No  Memory:  Immediate;   Good Recent;   Good Remote;   Good  Judgement:  Good  Insight:  Good  Psychomotor Activity:    Unable to assess due to telephone visit  Concentration:  Concentration: Good and Attention Span: Good  Recall:  Good  Fund of Knowledge: Good  Language: Good  Akathisia:  No  Handed:  Right  AIMS (if indicated): not done  Assets:  Communication Skills Desire for Improvement Financial Resources/Insurance Housing Leisure Time Social Support  ADL's:  Intact  Cognition: WNL  Sleep:  Poor   Screenings: GAD-7    Flowsheet Row Video Visit from 12/01/2020 in [facility] Video Visit from 08/31/2020 in [facility] Office Visit from 06/02/2020 in [facility]  Total GAD-7 Score 19 19 19       PHQ2-9    Flowsheet Row Video Visit from 12/01/2020 in [facility] Video Visit from 08/31/2020 in [facility] Office Visit from 07/03/2020 in [facility] Office Visit from 06/02/2020 in [facility] Office Visit from 03/31/2020 in [facility]  PHQ-2 Total Score 5 5 1 2 2   PHQ-9 Total Score 20 20 -- 14 --      Flowsheet Row Video Visit from 12/01/2020 in [facility] ED from 04/28/2020 in [facility]PART[facility]  C-SSRS RISK CATEGORY Error: Q7 should not be populated when Q6 is No No Risk        Assessment and Plan: Patient endorses symptoms of anxiety, depression, and psychosis.  Today she is agreeable to starting gabapentin 300 mg three times daily to help manage anxiety and pain. Potential side effects of medication and risks vs benefits of treatment vs non-treatment were explained and discussed. All questions  were answered.  1. Anxiety  Continue- clonazePAM (KLONOPIN) 0.5 MG tablet; Take 1 tablet (0.5 mg total) by mouth 2 (two) times daily as needed for anxiety.  Dispense: 60 tablet; Refill: 2  Continue- hydrOXYzine (ATARAX/VISTARIL) 10 MG tablet; Take 1 tablet (10 mg total) by mouth 3 (three) times daily as needed.  Dispense: 90 tablet; Refill: 3 Continue- sertraline (ZOLOFT) 100 MG tablet; Take 1 tablet (100 mg total) by mouth daily with breakfast.  Dispense: 30 tablet; Refill: 3 Start- gabapentin (NEURONTIN) 300 MG capsule; Take 1 capsule (300 mg total) by mouth 3 (three) times daily.  Dispense: 90 capsule; Refill: 3  2. Schizophrenia, paranoid (HCC)  Continue- doxepin (SINEQUAN) 100 MG capsule; Take 1 capsule (100 mg total) by mouth at bedtime.  Dispense: 30 capsule; Refill: 3 Continue- risperiDONE (RISPERDAL) 3 MG tablet; Take 1 tablet (3 mg total) by mouth at bedtime.  Dispense: 30 tablet; Refill: 3 Continue- sertraline (ZOLOFT) 100 MG tablet; Take 1 tablet (100 mg total) by mouth daily with breakfast.  Dispense: 30 tablet; Refill: 3  Follow up in 3 months  Shanna Cisco, NP 12/01/2020, 3:36 PM

## 2020-12-02 ENCOUNTER — Other Ambulatory Visit (HOSPITAL_COMMUNITY): Payer: Self-pay

## 2020-12-09 ENCOUNTER — Other Ambulatory Visit (HOSPITAL_COMMUNITY): Payer: Self-pay

## 2020-12-17 ENCOUNTER — Other Ambulatory Visit (HOSPITAL_COMMUNITY): Payer: Self-pay

## 2020-12-22 ENCOUNTER — Other Ambulatory Visit (HOSPITAL_COMMUNITY): Payer: Self-pay

## 2021-01-11 ENCOUNTER — Other Ambulatory Visit (HOSPITAL_COMMUNITY): Payer: Self-pay

## 2021-01-18 ENCOUNTER — Other Ambulatory Visit (HOSPITAL_COMMUNITY): Payer: Self-pay

## 2021-02-08 ENCOUNTER — Other Ambulatory Visit (HOSPITAL_COMMUNITY): Payer: Self-pay

## 2021-02-10 ENCOUNTER — Other Ambulatory Visit (HOSPITAL_COMMUNITY): Payer: Self-pay

## 2021-03-03 ENCOUNTER — Telehealth (INDEPENDENT_AMBULATORY_CARE_PROVIDER_SITE_OTHER): Payer: Medicaid Other | Admitting: Psychiatry

## 2021-03-03 ENCOUNTER — Other Ambulatory Visit (HOSPITAL_COMMUNITY): Payer: Self-pay

## 2021-03-03 ENCOUNTER — Encounter (HOSPITAL_COMMUNITY): Payer: Self-pay | Admitting: Psychiatry

## 2021-03-03 DIAGNOSIS — F200 Paranoid schizophrenia: Secondary | ICD-10-CM | POA: Diagnosis not present

## 2021-03-03 DIAGNOSIS — F419 Anxiety disorder, unspecified: Secondary | ICD-10-CM | POA: Diagnosis not present

## 2021-03-03 MED ORDER — DOXEPIN HCL 150 MG PO CAPS
150.0000 mg | ORAL_CAPSULE | Freq: Every day | ORAL | 3 refills | Status: DC
  Filled 2021-03-03: qty 30, 30d supply, fill #0
  Filled 2021-03-31: qty 30, 30d supply, fill #1
  Filled 2021-04-30: qty 30, 30d supply, fill #2
  Filled 2021-05-28: qty 30, 30d supply, fill #3

## 2021-03-03 MED ORDER — HYDROXYZINE HCL 10 MG PO TABS
10.0000 mg | ORAL_TABLET | Freq: Three times a day (TID) | ORAL | 3 refills | Status: DC | PRN
  Filled 2021-03-03: qty 90, 30d supply, fill #0
  Filled 2021-03-31: qty 90, 30d supply, fill #1
  Filled 2021-04-30: qty 90, 30d supply, fill #2
  Filled 2021-05-28: qty 90, 30d supply, fill #3

## 2021-03-03 MED ORDER — GABAPENTIN 400 MG PO CAPS
400.0000 mg | ORAL_CAPSULE | Freq: Three times a day (TID) | ORAL | 3 refills | Status: DC
  Filled 2021-03-03: qty 90, 30d supply, fill #0
  Filled 2021-03-31: qty 90, 30d supply, fill #1
  Filled 2021-04-30: qty 90, 30d supply, fill #2
  Filled 2021-05-28: qty 90, 30d supply, fill #3

## 2021-03-03 MED ORDER — RISPERIDONE 4 MG PO TABS
4.0000 mg | ORAL_TABLET | Freq: Every day | ORAL | 3 refills | Status: DC
  Filled 2021-03-03: qty 30, 30d supply, fill #0
  Filled 2021-03-31: qty 30, 30d supply, fill #1
  Filled 2021-04-30: qty 30, 30d supply, fill #2
  Filled 2021-05-28: qty 30, 30d supply, fill #3

## 2021-03-03 MED ORDER — CLONAZEPAM 0.5 MG PO TABS
0.5000 mg | ORAL_TABLET | Freq: Two times a day (BID) | ORAL | 2 refills | Status: AC | PRN
  Filled 2021-03-03: qty 60, 30d supply, fill #0
  Filled 2021-03-31: qty 60, 30d supply, fill #1
  Filled 2021-04-30: qty 60, 30d supply, fill #2

## 2021-03-03 MED ORDER — SERTRALINE HCL 100 MG PO TABS
100.0000 mg | ORAL_TABLET | Freq: Every day | ORAL | 3 refills | Status: DC
  Filled 2021-03-03: qty 30, 30d supply, fill #0
  Filled 2021-03-31: qty 30, 30d supply, fill #1
  Filled 2021-04-30: qty 30, 30d supply, fill #2
  Filled 2021-05-28: qty 30, 30d supply, fill #3

## 2021-03-03 NOTE — Progress Notes (Signed)
[facility] MD/PA/NP OP Progress Note Virtual Visit via Telephone Note  I connected with Anne York on 03/03/21 at 10:30 AM EST by telephone and verified that I am speaking with the correct person using two identifiers.  Location: Patient: home Provider: Clinic   I discussed the limitations, risks, security and privacy concerns of performing an evaluation and management service by telephone and the availability of in person appointments. I also discussed with the patient that there may be a patient responsible charge related to this service. The patient expressed understanding and agreed to proceed.   I provided 30 minutes of non-face-to-face time during this encounter.  03/03/2021 11:11 AM Anne York  MRN:  [id number]  Chief Complaint: "I have so much going"  HPI: 56 year old female seen today for follow-up psychiatric evaluation.  She has a psychiatric history of schizophrenia, anxiety, and depression.  She is currently managed on Klonopin 0.5 mg twice daily, gabapentin 300 mg three times daily, Risperdal 3 mg nightly, Zoloft 100 mg daily, hydroxyzine 10 mg three times daily as needed, doxepin 100 mg nightly.  She notes medications are somewhat effective in managing her psychiatric conditions.   Patient was unable to logon virtually so her exam was done over the phone.  During exam she was tearful but pleasant, cooperative, and engaged in conversation.  She informed Probation officer that a lot has been going on.  She reports that her son is no longer in the hospital but now has a warrant out for his arrest because a fatal car accident. She notes that she kicked him out of her home because he became combative. She also notes that he husband had open heart surgery at [facility].  She reports that when he told her he was having chest pain she cursed him out and did not take him seriously.  She now notes that she has not seen him in a while because he is in a different city. She also informed Probation officer that she has to be  out of her apartment soon but notes that she has an apartment waiting for her in [place] through [address].  Patient however notes that she does not have stable transportation.  She informed Probation officer that she is going to attempt to get a friend to take her to [place] today.   Patient notes that she continues to take her medications as prescribed but notes that she is struggling. She reports that she is having symptoms of psychosis.  She notes that she hears negative voices that curses her and tell her that she is worthless.  She denies visual hallucinations.  She informed Probation officer that she has been irritable, distractible, and notes that she has been having racing thoughts.  Patient informed writer that her sleep is poor noting that she sleeps 2 to 3 hours nightly.  She reports that she has slept this way for over a year.  Patient notes that the above exacerbates her anxiety and depression.  Provider conducted a GAD-7 with a score 21, her last visit she scored a 19.  Provider also conducted PHQ-9 and patient scored a 25, at her last visit she scored a 20.  She endorses decreased appetite appetite.  She reports losing 10 pounds since her last visit.   Patient informed writer that gabapentin has been effective in managing her pain.   Today patient agreeable to increasing gabapentin 300 mg 3 times daily to 400 mg 3 times daily to help manage anxiety, mood, and pain.  Doxepin also increased from 100mg   to 150 mg.  Provider recommended reducing/discontinuing Zoloft to prevent serotonin syndrome.  She however notes that she finds both doxepin and Zoloft somewhat effective and notes that at this time she does not want to stop it.  She denies current side effects.  Risperdal increased to 4 mg to help manage symptoms of psychosis and mood.  She will continue her other medications as prescribed.  Provider asked patient if she was interested in therapy however she notes that she was not.  No other concerns at this time.      Visit Diagnosis:    ICD-10-CM   1. Schizophrenia, paranoid ([place])  F20.0 risperidone (RISPERDAL) 4 MG tablet    doxepin (SINEQUAN) 150 MG capsule    sertraline (ZOLOFT) 100 MG tablet    2. Anxiety  F41.9 clonazePAM (KLONOPIN) 0.5 MG tablet    gabapentin (NEURONTIN) 400 MG capsule    hydrOXYzine (ATARAX) 10 MG tablet    sertraline (ZOLOFT) 100 MG tablet      Past Psychiatric History:  Schizophrenia, anxiety, depression    Past Medical History:  Past Medical History:  Diagnosis Date   GAD (generalized anxiety disorder)    GERD (gastroesophageal reflux disease)    Gout    HIV (human immunodeficiency virus infection) ([place]) Dx 2005   IDA (iron deficiency anemia)    Schizophrenia ([place])     Past Surgical History:  Procedure Laterality Date   CESAREAN SECTION     1994, 1996   Correction of hallux valgus by double osteotomy, bilateral  2015   LUNG SURGERY     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    Family Psychiatric History:  denied  Family History:  Family History  Problem Relation Age of Onset   Diabetes Mother    Hypertension Mother    Diabetes Brother    Kidney failure Other     Social History:  Social History   Socioeconomic History   Marital status: Single    Spouse name: Not on file   Number of children: Not on file   Years of education: Not on file   Highest education level: Not on file  Occupational History   Not on file  Tobacco Use   Smoking status: Former    Types: Cigarettes   Smokeless tobacco: Never  Substance and Sexual Activity   Alcohol use: Never   Drug use: Yes    Types: Marijuana   Sexual activity: Not Currently  Other Topics Concern   Not on file  Social History Narrative   Not on file   Social Determinants of Health   Financial Resource Strain: Not on file  Food Insecurity: Not on file  Transportation Needs: Not on file  Physical Activity: Not on file  Stress: Not on file  Social Connections: Not on file    Allergies:  No Known Allergies  Metabolic Disorder Labs: No results found for: HGBA1C, MPG No results found for: PROLACTIN No results found for: CHOL, TRIG, HDL, CHOLHDL, VLDL, LDLCALC No results found for: TSH  Therapeutic Level Labs: No results found for: LITHIUM No results found for: VALPROATE No components found for:  CBMZ  Current Medications: Current Outpatient Medications  Medication Sig Dispense Refill   albuterol (VENTOLIN HFA) 108 (90 Base) MCG/ACT inhaler Inhale 1 puff into the lungs every 4 (four) hours as needed for wheezing or shortness of breath.     allopurinol (ZYLOPRIM) 100 MG tablet Take 100 mg by mouth daily.     beclomethasone (QVAR) 80  MCG/ACT inhaler Inhale 2 puffs into the lungs 2 (two) times daily. 1 each 3   clonazePAM (KLONOPIN) 0.5 MG tablet Take 1 tablet (0.5 mg total) by mouth 2 (two) times daily as needed for anxiety. 60 tablet 2   Darunavir-Cobicistat-Emtricitabine-Tenofovir Alafenamide (SYMTUZA) 800-150-200-10 MG TABS TAKE 1 TABLET BY MOUTH DAILY WITH BREAKFAST. 30 tablet 6   diclofenac Sodium (VOLTAREN) 1 % GEL Apply 4 grams to affected area 4 (four) times daily. 400 g 4   doxepin (SINEQUAN) 150 MG capsule Take 1 capsule (150 mg total) by mouth at bedtime. 30 capsule 3   gabapentin (NEURONTIN) 400 MG capsule Take 1 capsule (400 mg total) by mouth 3 (three) times daily. 90 capsule 3   hydrocortisone 2.5 % ointment Apply topically 2 (two) times daily.     hydrOXYzine (ATARAX) 10 MG tablet Take 1 tablet (10 mg total) by mouth 3 (three) times daily as needed. 90 tablet 3   montelukast (SINGULAIR) 10 MG tablet Take 1 tablet (10 mg total) by mouth at bedtime. 30 tablet 11   risperidone (RISPERDAL) 4 MG tablet Take 1 tablet (4 mg total) by mouth at bedtime. 30 tablet 3   sertraline (ZOLOFT) 100 MG tablet Take 1 tablet (100 mg total) by mouth daily with breakfast. 30 tablet 3   No current facility-administered medications for this visit.     Musculoskeletal: Strength  & Muscle Tone:  Unable to assess due to telephone visit Gait & Station:   Unable to assess due to telephone visit Patient leans: N/A  Psychiatric Specialty Exam: Review of Systems  There were no vitals taken for this visit.There is no height or weight on file to calculate BMI.  General Appearance:   Unable to assess due to telephone visit  Eye Contact:    Unable to assess due to telephone visit  Speech:  Clear and Coherent and Normal Rate  Volume:  Normal  Mood:  Anxious and Depressed  Affect:  Appropriate and Congruent  Thought Process:  Coherent, Goal Directed, and Linear  Orientation:  Full (Time, Place, and Person)  Thought Content: Logical and Hallucinations: Auditory   Suicidal Thoughts:  No  Homicidal Thoughts:  No  Memory:  Immediate;   Good Recent;   Good Remote;   Good  Judgement:  Good  Insight:  Good  Psychomotor Activity:    Unable to assess due to telephone visit  Concentration:  Concentration: Good and Attention Span: Good  Recall:  Good  Fund of Knowledge: Good  Language: Good  Akathisia:  No  Handed:  Right  AIMS (if indicated): not done  Assets:  Communication Skills Desire for Improvement Financial Resources/Insurance Housing Leisure Time Social Support  ADL's:  Intact  Cognition: WNL  Sleep:  Poor   Screenings: GAD-7    Flowsheet Row Video Visit from 03/03/2021 in [facility] Video Visit from 12/01/2020 in [facility] Video Visit from 08/31/2020 in [facility] Office Visit from 06/02/2020 in [place]  Total GAD-7 Score 21 19 19 19       PHQ2-9    [place] Video Visit from 03/03/2021 in [facility] Video Visit from 12/01/2020 in [facility] Video Visit from 08/31/2020 in [facility] Office Visit from 07/03/2020 in [facility] Office Visit from 06/02/2020 in [place]  PHQ-2 Total Score 6 5 5  1 2  PHQ-9 Total Score 25 20 20  -- 14      Flowsheet Row Video Visit from 03/03/2021 in [facility] Video Visit from 12/01/2020 in [facility] ED from 04/28/2020 in [place] [facility] Error: Q7 should not be populated when Q6 is No Error: Q7 should not be populated when Q6 is No No Risk        Assessment and Plan: Patient endorses symptoms of anxiety, depression, and psychosis.  Today patient agreeable to increasing gabapentin 300 mg 3 times daily to 400 mg 3 times daily to help manage anxiety, mood, and pain.  Doxepin also increased from 100mg  to 150 mg.  Provider recommended reducing/discontinuing Zoloft to prevent serotonin syndrome.  She however notes that she finds both doxepin and Zoloft somewhat effective and notes that at this time she does not want to stop it.  She denies current side effects.  Risperdal increased to 4 mg to help manage symptoms of psychosis and mood.  She will continue her other medications as prescribed.  Provider asked patient if she was interested in therapy however she notes that she was not  1. Schizophrenia, paranoid ([place])  Increased- risperidone (RISPERDAL) 4 MG tablet; Take 1 tablet (4 mg total) by mouth at bedtime.  Dispense: 30 tablet; Refill: 3 Increased- doxepin (SINEQUAN) 150 MG capsule; Take 1 capsule (150 mg total) by mouth at bedtime.  Dispense: 30 capsule; Refill: 3 Continue- sertraline (ZOLOFT) 100 MG tablet; Take 1 tablet (100 mg total) by mouth daily with breakfast.  Dispense: 30 tablet; Refill: 3  2. Anxiety  Continue- clonazePAM (KLONOPIN) 0.5 MG tablet; Take 1 tablet (0.5 mg total) by mouth 2 (two) times daily as needed for anxiety.  Dispense: 60 tablet; Refill: 2 Increased- gabapentin (NEURONTIN) 400 MG capsule; Take 1 capsule (400 mg  total) by mouth 3 (three) times daily.  Dispense: 90 capsule; Refill: 3 Increased- hydrOXYzine (ATARAX) 10 MG tablet; Take 1 tablet (10 mg total) by mouth 3 (three) times daily as needed.  Dispense: 90 tablet; Refill: 3 Continue- sertraline (ZOLOFT) 100 MG tablet; Take 1 tablet (100 mg total) by mouth daily with breakfast.  Dispense: 30 tablet; Refill: 3   Follow up in 3 months  Salley Slaughter, NP 03/03/2021, 11:11 AM

## 2021-03-04 ENCOUNTER — Other Ambulatory Visit (HOSPITAL_COMMUNITY): Payer: Self-pay

## 2021-03-05 ENCOUNTER — Other Ambulatory Visit (HOSPITAL_COMMUNITY): Payer: Self-pay

## 2021-03-09 ENCOUNTER — Other Ambulatory Visit (HOSPITAL_COMMUNITY): Payer: Self-pay

## 2021-03-31 ENCOUNTER — Other Ambulatory Visit (HOSPITAL_COMMUNITY): Payer: Self-pay

## 2021-04-27 ENCOUNTER — Other Ambulatory Visit (HOSPITAL_COMMUNITY): Payer: Self-pay

## 2021-04-30 ENCOUNTER — Other Ambulatory Visit (HOSPITAL_COMMUNITY): Payer: Self-pay

## 2021-05-03 ENCOUNTER — Other Ambulatory Visit (HOSPITAL_COMMUNITY): Payer: Self-pay

## 2021-05-04 ENCOUNTER — Other Ambulatory Visit (HOSPITAL_COMMUNITY): Payer: Self-pay

## 2021-05-17 ENCOUNTER — Telehealth (HOSPITAL_COMMUNITY): Payer: No Payment, Other | Admitting: Psychiatry

## 2021-05-25 ENCOUNTER — Other Ambulatory Visit (HOSPITAL_COMMUNITY): Payer: Self-pay

## 2021-05-27 ENCOUNTER — Other Ambulatory Visit (HOSPITAL_COMMUNITY): Payer: Self-pay

## 2021-05-28 ENCOUNTER — Other Ambulatory Visit (HOSPITAL_COMMUNITY): Payer: Self-pay

## 2021-05-28 ENCOUNTER — Other Ambulatory Visit: Payer: Self-pay | Admitting: Infectious Diseases

## 2021-05-28 ENCOUNTER — Other Ambulatory Visit (HOSPITAL_COMMUNITY): Payer: Self-pay | Admitting: Psychiatry

## 2021-05-28 DIAGNOSIS — B20 Human immunodeficiency virus [HIV] disease: Secondary | ICD-10-CM

## 2021-05-28 DIAGNOSIS — F419 Anxiety disorder, unspecified: Secondary | ICD-10-CM

## 2021-05-31 ENCOUNTER — Other Ambulatory Visit (HOSPITAL_COMMUNITY): Payer: Self-pay

## 2021-05-31 MED ORDER — SYMTUZA 800-150-200-10 MG PO TABS
1.0000 | ORAL_TABLET | Freq: Every day | ORAL | 2 refills | Status: DC
  Filled 2021-05-31: qty 30, 30d supply, fill #0
  Filled 2021-06-21: qty 30, 30d supply, fill #1
  Filled 2021-07-20: qty 30, 30d supply, fill #2

## 2021-06-01 ENCOUNTER — Other Ambulatory Visit (HOSPITAL_COMMUNITY): Payer: Self-pay

## 2021-06-10 ENCOUNTER — Other Ambulatory Visit (HOSPITAL_COMMUNITY): Payer: Self-pay

## 2021-06-15 ENCOUNTER — Telehealth: Payer: Self-pay | Admitting: Pharmacist

## 2021-06-15 DIAGNOSIS — F200 Paranoid schizophrenia: Secondary | ICD-10-CM

## 2021-06-15 DIAGNOSIS — F419 Anxiety disorder, unspecified: Secondary | ICD-10-CM

## 2021-06-15 NOTE — Telephone Encounter (Signed)
While speaking with a separate patient that occasionally stays with [facility] Paula asked me to help her with her healthcare needs. She identified herself as a patient of [facility] and asked that I open her chart to review her history and identify how I could help her.  ? ?Noted that she is overdue for follow up with PCP. With her permission, called [facility] and scheduled her for a visit on 07/08/21.  ? ?Called patient back to notify. She does request support with transportation. I will place a transportation referral. Though patient lives >25 miles from this practice, she requests to continue to see her established healthcare team.  ?

## 2021-06-17 ENCOUNTER — Telehealth: Payer: Self-pay

## 2021-06-17 ENCOUNTER — Other Ambulatory Visit (HOSPITAL_COMMUNITY): Payer: Self-pay

## 2021-06-17 NOTE — Telephone Encounter (Signed)
? ?  Telephone encounter was:  Unsuccessful.  06/17/2021 ?Name: Anne York MRN: [id number] DOB: Oct 23, 1965 ? ?Unsuccessful outbound call made today to assist with:  -Left message on voicemail for patient to return my call regarding transportation needs.  Left message on voicemail for [facility] to return my call. ? ?Outreach Attempt:  1st Attempt ? ?A HIPAA compliant voice message was left requesting a return call.  Instructed patient to call back at [phone]. ? ?Hart Haas, AAS Paralegal, CHC ?Care Guide  Embedded Care Coordination ?[place]  Care Management  ?[address] ?[place], [place] [address] ?[email or web address]  ?? [phone]   ?[email or web address] ?  ?

## 2021-06-18 ENCOUNTER — Telehealth: Payer: Self-pay

## 2021-06-18 NOTE — Telephone Encounter (Signed)
? ?  Telephone encounter was:  Unsuccessful.  06/18/2021 ?Name: Anne York MRN: [id number] DOB: 1965-04-01 ? ?Unsuccessful outbound call made today to assist with:  Transportation Needs  ? ?Outreach Attempt:  2nd Attempt ? ?A HIPAA compliant voice message was left requesting a return call.  Instructed patient to call back at [phone]. Left message on voicemail for patient to return my call regarding transportation assistance. Left message for Mickie Bail at [facility] to return my call.  ? ?Jericho Alcorn, AAS Paralegal, CHC ?Care Guide  Embedded Care Coordination ?[place]  Care Management  ?[address] ?[place], [place] [address] ?[email or web address]  ?? [phone]   ?[email or web address] ?  ?

## 2021-06-21 ENCOUNTER — Other Ambulatory Visit (HOSPITAL_COMMUNITY): Payer: Self-pay

## 2021-06-21 ENCOUNTER — Telehealth: Payer: Self-pay

## 2021-06-21 ENCOUNTER — Other Ambulatory Visit (HOSPITAL_COMMUNITY): Payer: Self-pay | Admitting: Psychiatry

## 2021-06-21 DIAGNOSIS — F200 Paranoid schizophrenia: Secondary | ICD-10-CM

## 2021-06-21 DIAGNOSIS — F419 Anxiety disorder, unspecified: Secondary | ICD-10-CM

## 2021-06-21 NOTE — Telephone Encounter (Signed)
? ?  Telephone encounter was:  Successful.  ?06/21/2021 ?Name: Nyna Chilton MRN: [id number] DOB: 1965-07-18 ? ?Xitlaly Ault is a 56 y.o. year old female who is a primary care patient of Doran Stabler, DO . The community resource team was consulted for assistance with Transportation Needs  ? ?Care guide performed the following interventions: Spoke with Katelyn at [facility] she will need to speak directly with the patient to get her setup with [facility]. [facility] normally goes to [place] on Tuesdays but they can arrange transportation if the patient's appointment is on another day.  Spoke with patient, gave her Katelyn's contact number [phone] at [facility] to call and setup her transportation.  Patient stated she was able to call unassisted. ? ?Follow Up Plan:  Care guide will follow up with patient by phone over the next 4 days ? ?Jahseh Lucchese, AAS Paralegal, CHC ?Care Guide  Embedded Care Coordination ?[place]  Care Management  ?[address] ?[place], [place] [address] ?[email or web address]  ?? [phone]   ?[email or web address] ?  ?

## 2021-06-23 ENCOUNTER — Other Ambulatory Visit (HOSPITAL_COMMUNITY): Payer: Self-pay

## 2021-06-24 ENCOUNTER — Other Ambulatory Visit (HOSPITAL_COMMUNITY): Payer: Self-pay

## 2021-06-25 ENCOUNTER — Telehealth: Payer: Self-pay

## 2021-06-25 NOTE — Telephone Encounter (Signed)
? ?  Telephone encounter was:  Unsuccessful.  06/25/2021 ?Name: Anne York MRN: [id number] DOB: 09-06-1965 ? ?Unsuccessful outbound call made today to assist with:  Transportation Needs  ? ?Outreach Attempt:  3rd Attempt.  Referral closed unable to contact patient. ? ?Unable to leave message voicemail full. ? ?Cabrina Shiroma, AAS Paralegal, CHC ?Care Guide  Embedded Care Coordination ?[place]  Care Management  ?[address] ?[place], [place] [address] ?[email or web address]  ?? [phone]   ?[email or web address] ?  ?

## 2021-07-03 ENCOUNTER — Other Ambulatory Visit (HOSPITAL_COMMUNITY): Payer: Self-pay

## 2021-07-03 ENCOUNTER — Other Ambulatory Visit (HOSPITAL_COMMUNITY): Payer: Self-pay | Admitting: Psychiatry

## 2021-07-03 DIAGNOSIS — F419 Anxiety disorder, unspecified: Secondary | ICD-10-CM

## 2021-07-03 DIAGNOSIS — F200 Paranoid schizophrenia: Secondary | ICD-10-CM

## 2021-07-05 ENCOUNTER — Other Ambulatory Visit (HOSPITAL_COMMUNITY): Payer: Self-pay

## 2021-07-08 ENCOUNTER — Encounter: Payer: Self-pay | Admitting: Student

## 2021-07-12 ENCOUNTER — Other Ambulatory Visit (HOSPITAL_COMMUNITY): Payer: Self-pay

## 2021-07-15 ENCOUNTER — Other Ambulatory Visit (HOSPITAL_COMMUNITY): Payer: Self-pay

## 2021-07-20 ENCOUNTER — Other Ambulatory Visit (HOSPITAL_COMMUNITY): Payer: Self-pay

## 2021-07-22 ENCOUNTER — Other Ambulatory Visit (HOSPITAL_COMMUNITY): Payer: Self-pay

## 2021-08-10 ENCOUNTER — Other Ambulatory Visit (HOSPITAL_COMMUNITY): Payer: Self-pay

## 2021-08-10 ENCOUNTER — Other Ambulatory Visit: Payer: Self-pay | Admitting: Infectious Diseases

## 2021-08-10 DIAGNOSIS — B20 Human immunodeficiency virus [HIV] disease: Secondary | ICD-10-CM

## 2021-08-17 ENCOUNTER — Other Ambulatory Visit (HOSPITAL_COMMUNITY): Payer: Self-pay

## 2021-08-18 ENCOUNTER — Other Ambulatory Visit (HOSPITAL_COMMUNITY): Payer: Self-pay

## 2021-08-19 ENCOUNTER — Other Ambulatory Visit (HOSPITAL_COMMUNITY): Payer: Self-pay

## 2021-08-20 ENCOUNTER — Encounter: Payer: Self-pay | Admitting: Infectious Diseases

## 2021-08-20 ENCOUNTER — Encounter: Payer: Self-pay | Admitting: Internal Medicine

## 2021-08-20 ENCOUNTER — Other Ambulatory Visit: Payer: Self-pay

## 2021-08-20 ENCOUNTER — Ambulatory Visit (INDEPENDENT_AMBULATORY_CARE_PROVIDER_SITE_OTHER): Payer: Medicaid Other | Admitting: Internal Medicine

## 2021-08-20 ENCOUNTER — Ambulatory Visit (INDEPENDENT_AMBULATORY_CARE_PROVIDER_SITE_OTHER): Payer: Medicaid Other | Admitting: Infectious Diseases

## 2021-08-20 ENCOUNTER — Other Ambulatory Visit (HOSPITAL_COMMUNITY): Payer: Self-pay

## 2021-08-20 VITALS — BP 121/84 | HR 110 | Temp 97.7°F | Wt 159.0 lb

## 2021-08-20 DIAGNOSIS — M109 Gout, unspecified: Secondary | ICD-10-CM | POA: Diagnosis present

## 2021-08-20 DIAGNOSIS — Z21 Asymptomatic human immunodeficiency virus [HIV] infection status: Secondary | ICD-10-CM

## 2021-08-20 DIAGNOSIS — B20 Human immunodeficiency virus [HIV] disease: Secondary | ICD-10-CM

## 2021-08-20 DIAGNOSIS — F200 Paranoid schizophrenia: Secondary | ICD-10-CM | POA: Diagnosis not present

## 2021-08-20 DIAGNOSIS — F419 Anxiety disorder, unspecified: Secondary | ICD-10-CM

## 2021-08-20 MED ORDER — RISPERIDONE 4 MG PO TABS
4.0000 mg | ORAL_TABLET | Freq: Every day | ORAL | 0 refills | Status: DC
  Filled 2021-08-20: qty 30, 30d supply, fill #0

## 2021-08-20 MED ORDER — DICLOFENAC SODIUM 1 % EX GEL
4.0000 g | Freq: Four times a day (QID) | CUTANEOUS | 4 refills | Status: AC
  Filled 2021-08-20: qty 400, 25d supply, fill #0

## 2021-08-20 MED ORDER — SYMTUZA 800-150-200-10 MG PO TABS: 1.0000 | ORAL_TABLET | Freq: Every day | ORAL | 5 refills | Status: DC

## 2021-08-20 MED ORDER — COLCHICINE 0.6 MG PO TABS
0.6000 mg | ORAL_TABLET | Freq: Every day | ORAL | 0 refills | Status: AC | PRN
  Filled 2021-08-20: qty 90, 90d supply, fill #0

## 2021-08-20 MED ORDER — SERTRALINE HCL 100 MG PO TABS
100.0000 mg | ORAL_TABLET | Freq: Every day | ORAL | 0 refills | Status: AC
  Filled 2021-08-20: qty 30, 30d supply, fill #0

## 2021-08-20 MED ORDER — ALLOPURINOL 100 MG PO TABS
100.0000 mg | ORAL_TABLET | Freq: Every day | ORAL | 0 refills | Status: AC
  Filled 2021-08-20: qty 90, 90d supply, fill #0

## 2021-08-20 NOTE — Progress Notes (Signed)
   CC: follow up  HPI:  Ms.Gurbani Granier is a 56 y.o. with medical history as below presenting to [facility] for follow up.  Please see problem-based list for further details, assessments, and plans.  Past Medical History:  Diagnosis Date   GAD (generalized anxiety disorder)    GERD (gastroesophageal reflux disease)    Gout    HIV (human immunodeficiency virus infection) (HCC) Dx 2005   IDA (iron deficiency anemia)    Schizophrenia (HCC)       Current Outpatient Medications (Respiratory):    albuterol (VENTOLIN HFA) 108 (90 Base) MCG/ACT inhaler, Inhale 1 puff into the lungs every 4 (four) hours as needed for wheezing or shortness of breath.   beclomethasone (QVAR) 80 MCG/ACT inhaler, Inhale 2 puffs into the lungs 2 (two) times daily.   montelukast (SINGULAIR) 10 MG tablet, Take 1 tablet (10 mg total) by mouth at bedtime.  Current Outpatient Medications (Analgesics):    allopurinol (ZYLOPRIM) 100 MG tablet, Take 100 mg by mouth daily.   Current Outpatient Medications (Other):    clonazePAM (KLONOPIN) 0.5 MG tablet, Take 1 tablet (0.5 mg total) by mouth 2 (two) times daily as needed for anxiety.   Darunavir-Cobicistat-Emtricitabine-Tenofovir Alafenamide (SYMTUZA) 800-150-200-10 MG TABS, TAKE 1 TABLET BY MOUTH DAILY WITH BREAKFAST.   diclofenac Sodium (VOLTAREN) 1 % GEL, Apply 4 grams to affected area 4 (four) times daily.   doxepin (SINEQUAN) 150 MG capsule, Take 1 capsule (150 mg total) by mouth at bedtime.   gabapentin (NEURONTIN) 400 MG capsule, Take 1 capsule (400 mg total) by mouth 3 (three) times daily.   hydrocortisone 2.5 % ointment, Apply topically 2 (two) times daily.   hydrOXYzine (ATARAX) 10 MG tablet, Take 1 tablet (10 mg total) by mouth 3 (three) times daily as needed.   risperidone (RISPERDAL) 4 MG tablet, Take 1 tablet (4 mg total) by mouth at bedtime.   sertraline (ZOLOFT) 100 MG tablet, Take 1 tablet (100 mg total) by mouth daily with breakfast.  Review of Systems:   Review of system negative unless stated in the problem list or HPI.    Physical Exam:  Vitals:   08/20/21 1023  BP: 119/78  Pulse: 96  Weight: 164 lb (74.4 kg)    Physical Exam General: NAD HENT: NCAT Lungs: CTAB, no wheeze, rhonchi or rales.  Cardiovascular: Normal heart sounds, no r/m/g, 2+ pulses in all extremities. No LE edema Abdomen: No TTP, normal bowel sounds MSK: No asymmetry or muscle atrophy. TTP on bilateral great toes. No erythema appreciated.  Skin: no lesions noted on exposed skin Neuro: Alert and oriented x4. CN grossly intact Psych: Normal mood and normal affect   Assessment & Plan:   See Encounters Tab for problem based charting.  Patient discussed with Dr. Karrie Meres, MD

## 2021-08-20 NOTE — Assessment & Plan Note (Signed)
Very well controlled on once daily Symtuza. No concerns with access or adherence to medication. They are tolerating the medication well without side effects. No drug interactions identified.  She has [place] Medicaid that is active - I gave her a 30-d bottle of symtuza today to have as a buffer in the event the local [facility] near her sister does not fill the specialty medication. We will see.  No dental needs today.  Has mental health team  Sexual health discussed including screening needs - no needs today  She would like to come back to see me and stay in care here in [place]  Return in about 6 months (around 02/19/2022).

## 2021-08-20 NOTE — Patient Instructions (Signed)
[name], it was a pleasure seeing you today! You endorsed feeling well today. Below are some of the things we talked about this visit. We look forward to seeing you in the follow up appointment!  Today we discussed: You presented for a follow-up.  You just came from the infectious disease office and are taking the HIV medication. Your biggest complaint today was gout.  You are not taking any medication for that.  We will check your lab and start the medication for that.  We have given you 2 medicines 1 that you take every day and the other one that you take as needed called colchicine.  Take the colchicine for the 2 weeks as you start on the other medicine and then after that take it as needed. You have been out of your mental health medicines and I recommend you contacting that office to be seen as soon as possible.  We will send in 1 month of the critical medications as you get that process taking care of.  It is very important that you reach out to behavioral health at your earliest convenience.  I have ordered the following labs today:   Lab Orders         Uric acid       Referrals ordered today:   Referral Orders  No referral(s) requested today     I have ordered the following medication/changed the following medications:   Stop the following medications: Medications Discontinued During This Encounter  Medication Reason   allopurinol (ZYLOPRIM) 100 MG tablet Duplicate     Start the following medications: Meds ordered this encounter  Medications   allopurinol (ZYLOPRIM) 100 MG tablet    Sig: Take 1 tablet (100 mg total) by mouth daily.    Dispense:  90 tablet    Refill:  0   colchicine 0.6 MG tablet    Sig: Take 1 tablet (0.6 mg total) by mouth daily as needed. Take for 2 weeks while taking allopurinol and then take as needed for a gout flare.    Dispense:  90 tablet    Refill:  0     Follow-up: 3 month follow up  Please make sure to arrive 15 minutes prior to  your next appointment. If you arrive late, you may be asked to reschedule.   We look forward to seeing you next time. Please call our clinic at [phone] if you have any questions or concerns. The best time to call is Monday-Friday from 9am-4pm, but there is someone available 24/7. If after hours or the weekend, call the main hospital number and ask for the Internal Medicine Resident On-Call. If you need medication refills, please notify your pharmacy one week in advance and they will send [place] a request.  Thank you for letting us take part in your care. Wishing you the best!  Thank you, Gwenevere Abbot, MD

## 2021-08-20 NOTE — Progress Notes (Signed)
Name: Anne York  DOB: 17-Aug-1965 MRN: [id number] PCP: Gaylan Gerold, DO  Pulmonologist: Dr. Lamonte Sakai    Brief Narrative:  Anne York is a 56 y.o. female with HIV dx 2005 in [place] at [facility].  CD4 nadir < 200  HIV Risk: heterosexual  History of OIs: none Intake Labs 2022: Hep B sAg (-), sAb (+), cAb (-); Hep A (+), Hep C (-) Quantiferon (-) HLA B*5701 (-) G6PD: ()   Previous Regimens: 2013 - Prezista + Norvir + Truvada  Symtuza   Genotypes: 2014 - K103N noted     Subjective:   Chief Complaint  Patient presents with   Follow-up    b20      HPI: Anne York is here for HIV follow up care. Anne York has continued her Symtuza once daily without any missed doses. No problems with access.   Went to the ER in May 2023 - had been on naproxen before for pain control. Went to the ER near her and Anne York had a steroid shot and steroid. Estimates more than 5 times a year for gout flare in the past. Anne York was diagnosed with a blood test from what Anne York states. Topical creams and anti-inflammatories do seem to help ease the pain at times but Anne York can tell when there is a flare.   Just got Section 8 housing in [place]. Anne York walks a lot in the area because transportation is limited for her. Anne York is traveling to [place] to stay with her sister temporarily. Anne York is trying to troubleshoot how to get her medications.    Review of Systems  Constitutional:  Negative for activity change, chills, diaphoresis and fatigue.  Respiratory: Negative.    Cardiovascular: Negative.   Gastrointestinal: Negative.   Musculoskeletal:  Positive for arthralgias and joint swelling.     Past Medical History:  Diagnosis Date   GAD (generalized anxiety disorder)    GERD (gastroesophageal reflux disease)    Gout    HIV (human immunodeficiency virus infection) ([place]) Dx 2005   IDA (iron deficiency anemia)    Schizophrenia ([place])     Outpatient Medications Prior to Visit  Medication Sig Dispense Refill    albuterol (VENTOLIN HFA) 108 (90 Base) MCG/ACT inhaler Inhale 1 puff into the lungs every 4 (four) hours as needed for wheezing or shortness of breath.     beclomethasone (QVAR) 80 MCG/ACT inhaler Inhale 2 puffs into the lungs 2 (two) times daily. 1 each 3   clonazePAM (KLONOPIN) 0.5 MG tablet Take 1 tablet (0.5 mg total) by mouth 2 (two) times daily as needed for anxiety. 60 tablet 2   doxepin (SINEQUAN) 150 MG capsule Take 1 capsule (150 mg total) by mouth at bedtime. 30 capsule 3   gabapentin (NEURONTIN) 400 MG capsule Take 1 capsule (400 mg total) by mouth 3 (three) times daily. 90 capsule 3   hydrocortisone 2.5 % ointment Apply topically 2 (two) times daily.     hydrOXYzine (ATARAX) 10 MG tablet Take 1 tablet (10 mg total) by mouth 3 (three) times daily as needed. 90 tablet 3   montelukast (SINGULAIR) 10 MG tablet Take 1 tablet (10 mg total) by mouth at bedtime. 30 tablet 11   allopurinol (ZYLOPRIM) 100 MG tablet Take 100 mg by mouth daily.     Darunavir-Cobicistat-Emtricitabine-Tenofovir Alafenamide (SYMTUZA) 800-150-200-10 MG TABS TAKE 1 TABLET BY MOUTH DAILY WITH BREAKFAST. 30 tablet 2   diclofenac Sodium (VOLTAREN) 1 % GEL Apply 4 grams to affected area 4 (four) times daily. [place]  g 4   risperidone (RISPERDAL) 4 MG tablet Take 1 tablet (4 mg total) by mouth at bedtime. 30 tablet 3   sertraline (ZOLOFT) 100 MG tablet Take 1 tablet (100 mg total) by mouth daily with breakfast. 30 tablet 3   No facility-administered medications prior to visit.     No Known Allergies  Social History   Tobacco Use   Smoking status: Former    Types: Cigarettes   Smokeless tobacco: Never  Substance Use Topics   Alcohol use: Never   Drug use: Yes    Types: Marijuana    Family History  Problem Relation Age of Onset   Diabetes Mother    Hypertension Mother    Diabetes Brother    Kidney failure Other     Social History   Substance and Sexual Activity  Sexual Activity Not Currently   Comment:  declined condom     Objective:   Vitals:   08/20/21 0849  BP: 121/84  Pulse: (!) 110  Temp: 97.7 F (36.5 C)  TempSrc: Oral  Weight: 159 lb (72.1 kg)   Body mass index is 28.17 kg/m.  Physical Exam HENT:     Mouth/Throat:     Mouth: No oral lesions.     Dentition: No dental abscesses.  Cardiovascular:     Rate and Rhythm: Normal rate and regular rhythm.     Heart sounds: Normal heart sounds.  Pulmonary:     Effort: Pulmonary effort is normal.     Breath sounds: Rhonchi present.  Abdominal:     General: There is no distension.     Palpations: Abdomen is soft.     Tenderness: There is no abdominal tenderness.  Musculoskeletal:        General: No tenderness. Normal range of motion.  Lymphadenopathy:     Cervical: No cervical adenopathy.  Skin:    General: Skin is warm and dry.     Findings: Rash (flat macular hyperpigmented rash that is unchanged at the base of her back. ) present.  Neurological:     Mental Status: Anne York is alert and oriented to person, place, and time.  Psychiatric:        Judgment: Judgment normal.     Lab Results Lab Results  Component Value Date   WBC 4.5 11/20/2020   HGB 13.7 11/20/2020   HCT 41.1 11/20/2020   MCV 99.0 11/20/2020   PLT 197 11/20/2020    Lab Results  Component Value Date   CREATININE 0.84 11/20/2020   BUN 11 11/20/2020   NA 141 11/20/2020   K 3.4 (L) 11/20/2020   CL 107 11/20/2020   CO2 23 11/20/2020    Lab Results  Component Value Date   ALT 17 11/20/2020   AST 75 (H) 11/20/2020   BILITOT 0.4 11/20/2020    No results found for: "CHOL", "HDL", "LDLCALC", "LDLDIRECT", "TRIG", "CHOLHDL" HIV 1 RNA Quant (Copies/mL)  Date Value  11/20/2020 Not Detected  09/02/2020 Not Detected  03/31/2020 <20   CD4 T Cell Abs (/uL)  Date Value  09/02/2020 405  03/31/2020 457     Assessment & Plan:   Problem List Items Addressed This Visit       Unprioritized   Gout    Improvement in symptoms with prednisone and  naproxen. Anne York was seen in ER recently for this problem. Given her description of frequent flares Anne York may benefit from suppressive treatment. I encouraged her to discuss with her internal medicine team at her appointment later  today. Would like to keep her off prednisone given Symtuza interacts with it and augments levels of prednisone in the blood.       HIV (human immunodeficiency virus infection) ([place]) - Primary    Very well controlled on once daily Symtuza. No concerns with access or adherence to medication. They are tolerating the medication well without side effects. No drug interactions identified.  Anne York has [place] Medicaid that is active - I gave her a 30-d bottle of symtuza today to have as a buffer in the event the local [facility] near her sister does not fill the specialty medication. We will see.  No dental needs today.  Has mental health team  Sexual health discussed including screening needs - no needs today  Anne York would like to come back to see me and stay in care here in [place]  Return in about 6 months (around 02/19/2022).        Relevant Medications   Darunavir-Cobicistat-Emtricitabine-Tenofovir Alafenamide (SYMTUZA) 800-150-200-10 MG TABS   Other Relevant Orders   HIV 1 RNA quant-no reflex-bld   T-helper cells (CD4) count   Total Encounter Time: 30 min in review of documents Anne York provided me, coordination of out of state medication fill plan, counseling on drug interactions and follow up plan.    Janene Madeira, MSN, NP-C [facility] [place] Pager: [phone] Office: [phone]  08/20/21  6:32 PM

## 2021-08-20 NOTE — Patient Instructions (Addendum)
I want you to talk with your internal medicine team about the gout attacks. You may benefit from a daily medication to try to decrease the flare ups.  Allopurinol    Will send in your Symtuza - I do worry that with the [place] Medicaid will not allow an out of state fill. Incase you need it - you have an extra 30-day supply. Try to touch base with your previous ID clinic team down there.

## 2021-08-20 NOTE — Assessment & Plan Note (Addendum)
Improvement in symptoms with prednisone and naproxen. She was seen in ER recently for this problem. Given her description of frequent flares she may benefit from suppressive treatment. I encouraged her to discuss with her internal medicine team at her appointment later today. Would like to keep her off prednisone given Symtuza interacts with it and augments levels of prednisone in the blood.

## 2021-08-22 LAB — URIC ACID: Uric Acid: 7.7 mg/dL — ABNORMAL HIGH (ref 3.0–7.2)

## 2021-08-22 NOTE — Assessment & Plan Note (Signed)
Patient has hx of gout. She was on allopurinol but has not taken it since 2022. She had a flare last month and visited an ED and was given NSAID and prednisone for it. She stated it helped her. On physical exam, she is still having TTP on great toe in bilateral feet. Plan is to obtain uric acid level today. We will start allopurinol 100 mg qd, and colchicine 0.6 mg qd prn. Advised patient to take colchicine for 2 weeks as she initiates her allopurinol.  -Start allopurinol 100 mg qd, titrate based on uric acid level.  -Colchicine 0.6 mg qd prn and daily for 2 weeks at initiation of allopurinol.

## 2021-08-22 NOTE — Assessment & Plan Note (Addendum)
Patient has schizophrenia and has been established with behavioral health. She has not been seen since 02/2021. She is out of her Risperdal for one month. She is endorsing auditory and visual hallucination. She reports hearing voices telling her negative things about her self and seeing dark figures. She is denying any SI/HI. She states she is leaving the state in 2 weeks for a couple of months. I asked to call her behavioral health center as soon as possible to schedule an appointment before leaving. She has only been seen using televisits. -Plan is to give her 1 month supply of Resperdal and have her follow up with behavioral health.

## 2021-08-22 NOTE — Assessment & Plan Note (Addendum)
Patient has anxiety and is on multiple medications. She is out of her medications as she has not seen her behavioral health provider since 02/2021. He rmeds include klonopin 0.5 mg BID prn, doxepin 150 mg qhs, atarax 10 mg TID, sertraline 100 mg qd and gabapentin 400 mg TID. I advised her to get an appointment to see behavioral health as soon as possible.  -Refill Zoloft 100 mg for 1 month -Advised patient to follow up with behavioral health

## 2021-08-22 NOTE — Assessment & Plan Note (Signed)
Follows with ID. Last seen 08/20/21. Labs obtained this visit for viral load and CD4 count.

## 2021-08-23 ENCOUNTER — Other Ambulatory Visit (HOSPITAL_COMMUNITY): Payer: Self-pay

## 2021-08-23 LAB — HIV-1 RNA QUANT-NO REFLEX-BLD
HIV 1 RNA Quant: NOT DETECTED Copies/mL
HIV-1 RNA Quant, Log: NOT DETECTED Log cps/mL

## 2021-08-23 LAB — T-HELPER CELLS (CD4) COUNT (NOT AT ARMC)
Absolute CD4: 500 cells/uL (ref 490–1740)
CD4 T Helper %: 36 % (ref 30–61)
Total lymphocyte count: 1389 cells/uL (ref 850–3900)

## 2021-08-26 NOTE — Progress Notes (Signed)
Internal Medicine Clinic Attending  Case discussed with the resident at the time of the visit.  We reviewed the resident's history and exam and pertinent patient test results.  I agree with the assessment, diagnosis, and plan of care documented in the resident's note.  

## 2021-08-30 ENCOUNTER — Other Ambulatory Visit: Payer: Self-pay | Admitting: Pharmacist

## 2021-08-30 DIAGNOSIS — Z21 Asymptomatic human immunodeficiency virus [HIV] infection status: Secondary | ICD-10-CM

## 2021-08-30 MED ORDER — SYMTUZA 800-150-200-10 MG PO TABS: 1.0000 | ORAL_TABLET | Freq: Every day | ORAL | 0 refills | Status: AC

## 2021-08-30 NOTE — Progress Notes (Signed)
Medication Samples have been provided to the patient.  Drug name: Symtuza        Strength: 800/150/200/10 mg Qty: 30 tablets (1 bottle)  LOT: [id number]    Exp.Date: 05/23/2023  Dosing instructions: Take one tablet by mouth once daily with food  The patient has been instructed regarding the correct time, dose, and frequency of taking this medication, including desired effects and most common side effects.   Anali Cabanilla L. Tamea Bai, PharmD, BCIDP, AAHIVP, CPP Clinical Pharmacist Practitioner Infectious Diseases Clinical Pharmacist [facility] 02/03/2020, 10:07 AM  

## 2021-08-31 ENCOUNTER — Other Ambulatory Visit (HOSPITAL_COMMUNITY): Payer: Self-pay

## 2021-09-06 ENCOUNTER — Telehealth: Payer: Self-pay

## 2021-09-06 NOTE — Telephone Encounter (Signed)
Called patient to relay message below. Patient verbalized understanding and had no further questions.

## 2021-09-06 NOTE — Telephone Encounter (Signed)
-----   Message from Blanchard Kelch, NP sent at 08/26/2021 10:36 AM EDT ----- Please call [facility] to let her know that her viral load is undetectable and her immune system looks great. CD4 count is 500.

## 2021-09-08 ENCOUNTER — Other Ambulatory Visit: Payer: Self-pay | Admitting: Infectious Diseases

## 2021-09-08 ENCOUNTER — Other Ambulatory Visit (HOSPITAL_COMMUNITY): Payer: Self-pay

## 2021-09-08 DIAGNOSIS — B20 Human immunodeficiency virus [HIV] disease: Secondary | ICD-10-CM

## 2021-09-09 ENCOUNTER — Telehealth (INDEPENDENT_AMBULATORY_CARE_PROVIDER_SITE_OTHER): Payer: Medicaid Other | Admitting: Psychiatry

## 2021-09-09 ENCOUNTER — Encounter (HOSPITAL_COMMUNITY): Payer: Self-pay | Admitting: Psychiatry

## 2021-09-09 ENCOUNTER — Other Ambulatory Visit (HOSPITAL_COMMUNITY): Payer: Self-pay

## 2021-09-09 DIAGNOSIS — F200 Paranoid schizophrenia: Secondary | ICD-10-CM

## 2021-09-09 DIAGNOSIS — F419 Anxiety disorder, unspecified: Secondary | ICD-10-CM | POA: Diagnosis not present

## 2021-09-09 MED ORDER — DOXEPIN HCL 150 MG PO CAPS
150.0000 mg | ORAL_CAPSULE | Freq: Every day | ORAL | 3 refills | Status: AC
  Filled 2021-09-09: qty 30, 30d supply, fill #0

## 2021-09-09 MED ORDER — HYDROXYZINE HCL 10 MG PO TABS
10.0000 mg | ORAL_TABLET | Freq: Three times a day (TID) | ORAL | 3 refills | Status: AC | PRN
  Filled 2021-09-09: qty 90, 30d supply, fill #0

## 2021-09-09 MED ORDER — GABAPENTIN 400 MG PO CAPS
400.0000 mg | ORAL_CAPSULE | Freq: Three times a day (TID) | ORAL | 3 refills | Status: AC
  Filled 2021-09-09: qty 90, 30d supply, fill #0

## 2021-09-09 MED ORDER — RISPERIDONE 4 MG PO TABS
4.0000 mg | ORAL_TABLET | Freq: Every day | ORAL | 3 refills | Status: AC
  Filled 2021-09-09: qty 30, 30d supply, fill #0
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
## 2021-09-10 ENCOUNTER — Other Ambulatory Visit (HOSPITAL_COMMUNITY): Payer: Self-pay

## 2021-09-14 ENCOUNTER — Telehealth: Payer: Self-pay

## 2021-09-14 ENCOUNTER — Other Ambulatory Visit (HOSPITAL_COMMUNITY): Payer: Self-pay

## 2021-09-14 ENCOUNTER — Other Ambulatory Visit: Payer: Self-pay | Admitting: Infectious Diseases

## 2021-09-14 DIAGNOSIS — B20 Human immunodeficiency virus [HIV] disease: Secondary | ICD-10-CM

## 2021-09-14 MED ORDER — SYMTUZA 800-150-200-10 MG PO TABS
1.0000 | ORAL_TABLET | Freq: Every day | ORAL | 5 refills | Status: AC
  Filled 2021-09-14: qty 30, 30d supply, fill #0
  Filled 2021-10-08: qty 30, 30d supply, fill #1

## 2021-09-14 NOTE — Telephone Encounter (Signed)
-----   Message from Bobette Mo, CPhT sent at 09/14/2021  1:40 PM EDT ----- Regarding: Anne York,  Would you be able to send this patient Symtuza back to [facility] because she said she is not living in [place] , (her script was sent to a [facility] out there)...    Thank You,  Clearance Coots, CPhT Specialty Pharmacy Patient [facility] Phone: [phone] Fax: [phone]

## 2021-10-04 ENCOUNTER — Other Ambulatory Visit (HOSPITAL_COMMUNITY): Payer: Self-pay

## 2021-10-06 ENCOUNTER — Other Ambulatory Visit (HOSPITAL_COMMUNITY): Payer: Self-pay

## 2021-10-08 ENCOUNTER — Other Ambulatory Visit (HOSPITAL_COMMUNITY): Payer: Self-pay

## 2021-11-02 ENCOUNTER — Other Ambulatory Visit (HOSPITAL_COMMUNITY): Payer: Self-pay

## 2021-11-04 ENCOUNTER — Other Ambulatory Visit (HOSPITAL_COMMUNITY): Payer: Self-pay

## 2021-11-08 ENCOUNTER — Other Ambulatory Visit (HOSPITAL_COMMUNITY): Payer: Self-pay

## 2021-11-12 ENCOUNTER — Other Ambulatory Visit (HOSPITAL_COMMUNITY): Payer: Self-pay

## 2021-12-20 ENCOUNTER — Telehealth (HOSPITAL_COMMUNITY): Payer: No Payment, Other | Admitting: Psychiatry

## 2021-12-23 ENCOUNTER — Other Ambulatory Visit (HOSPITAL_COMMUNITY): Payer: Self-pay

## 2022-02-25 ENCOUNTER — Other Ambulatory Visit (HOSPITAL_COMMUNITY): Payer: Self-pay

## 2022-02-26 IMAGING — CR DG CHEST 2V
2 series · 2 of 2 positions shown · non-contrast
Comparison: None.

CLINICAL DATA: Shortness of breath.  History of asthma

EXAM:
CHEST - 2 VIEW

[w chest pa]
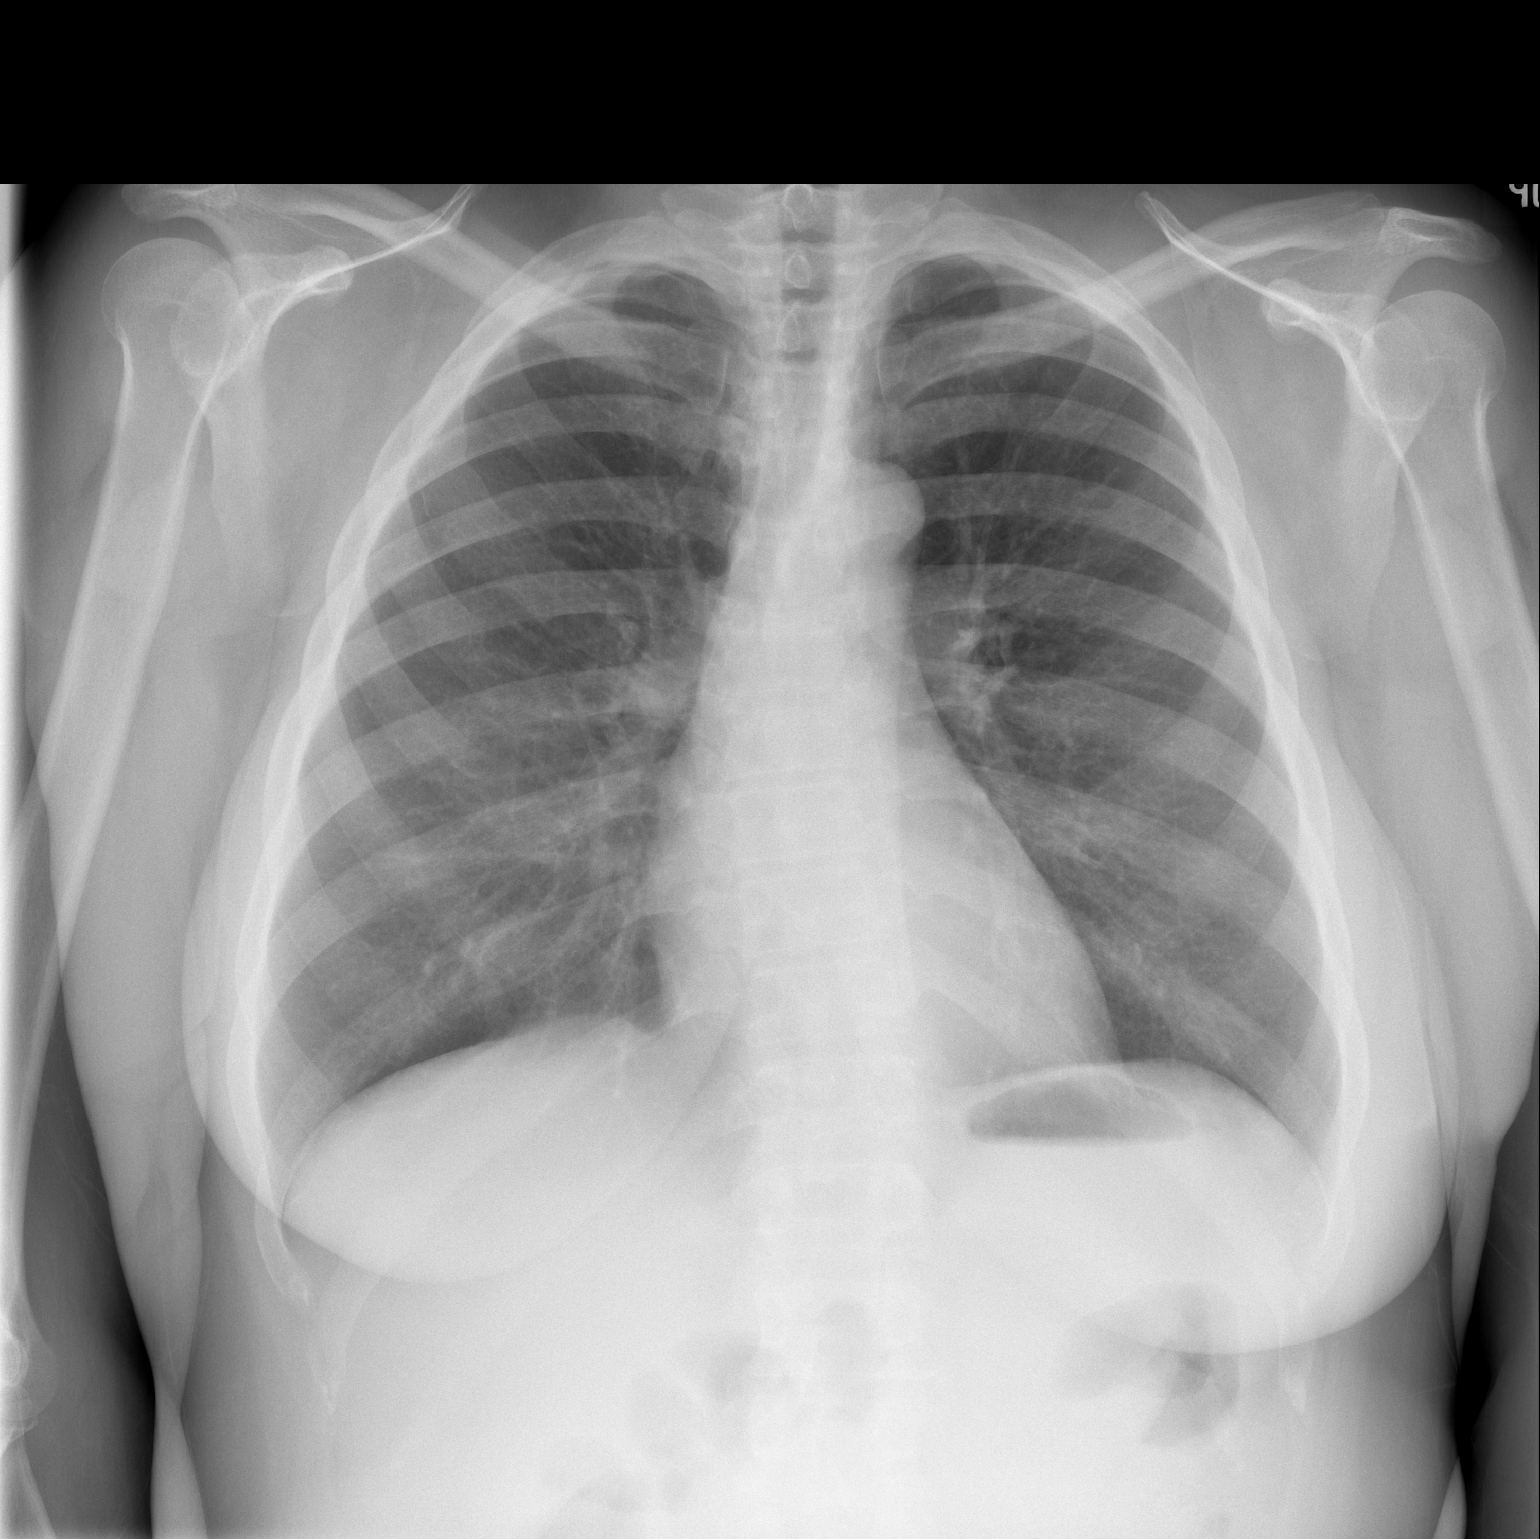

[w chest lat]
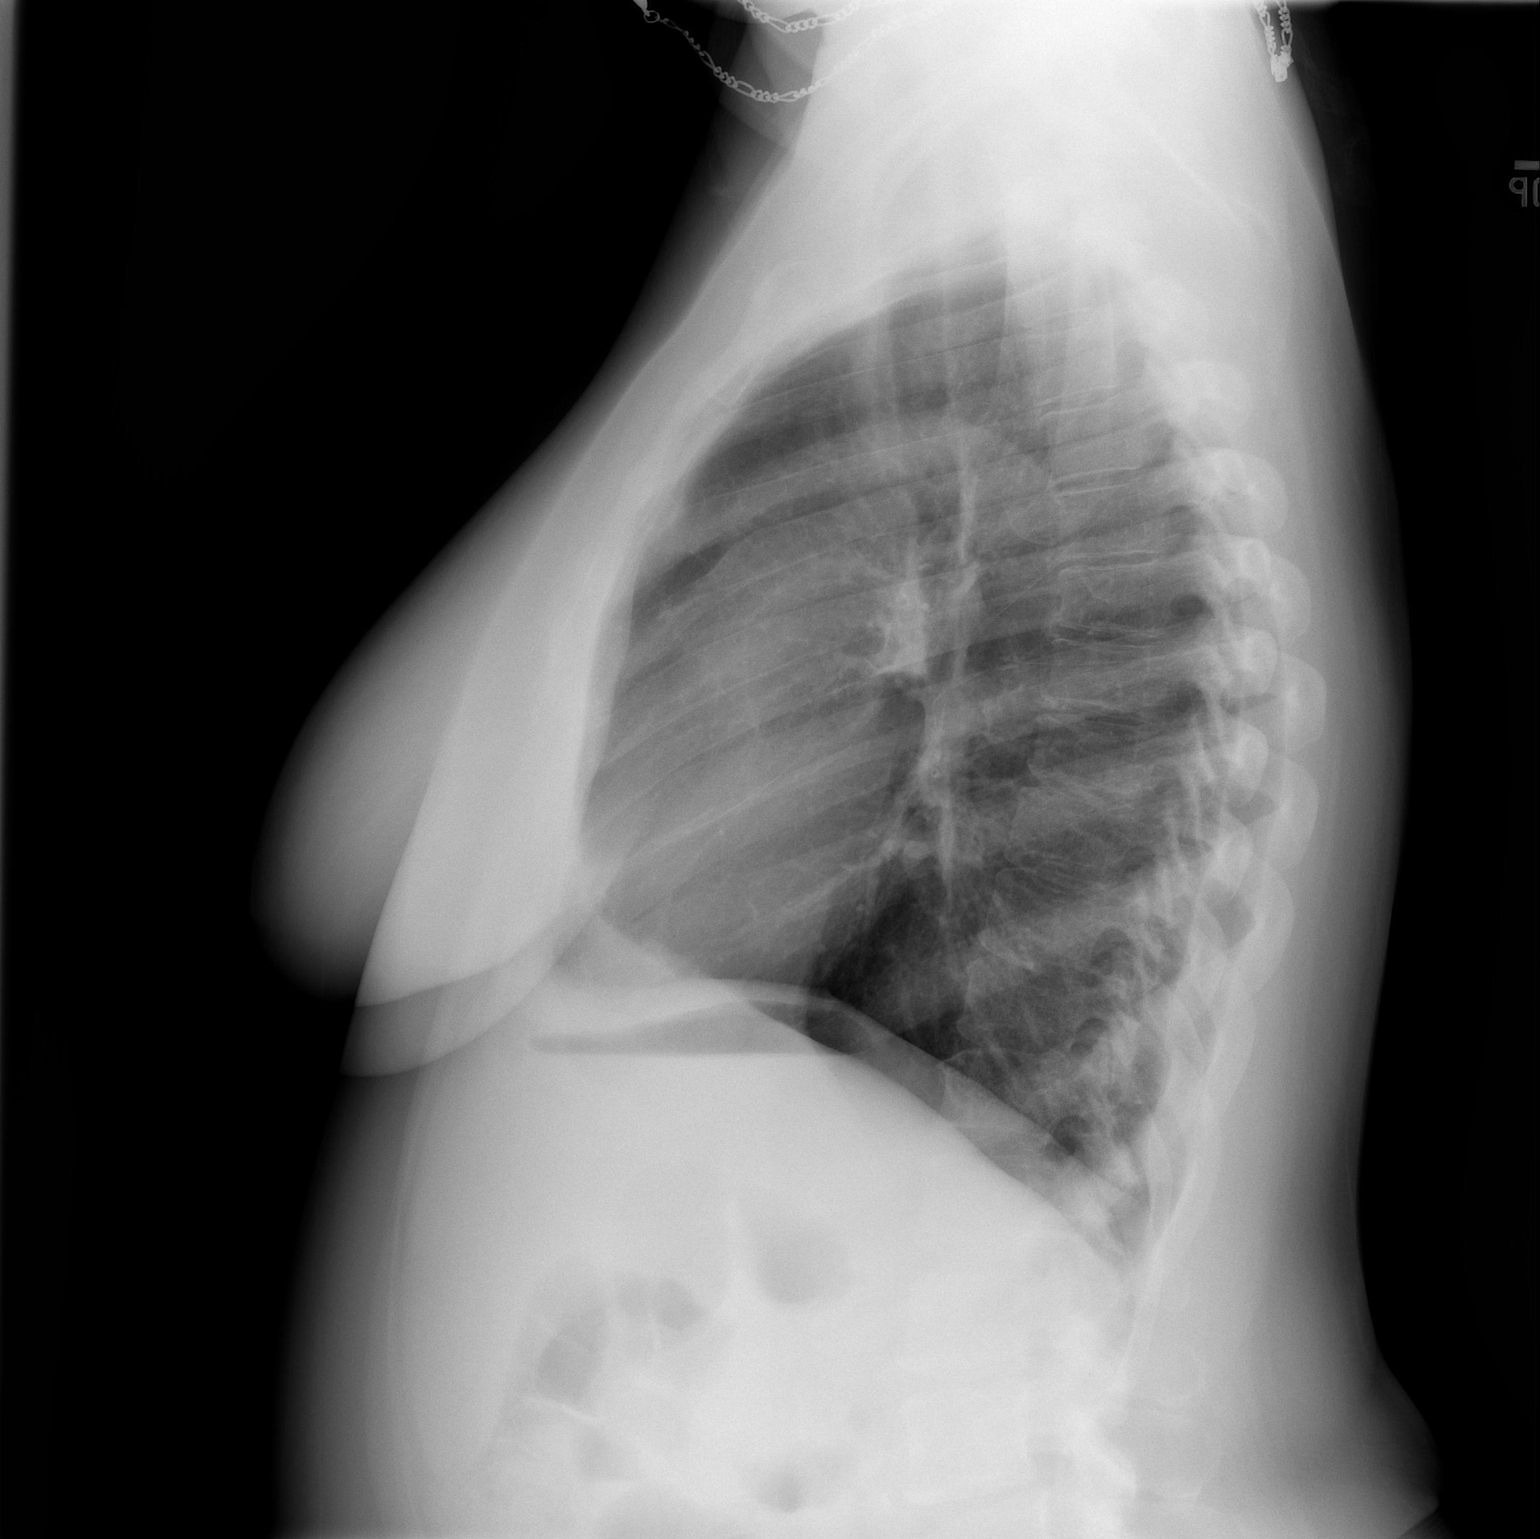

[2 of 2 positions shown; findings below may reference images not displayed]

FINDINGS: The heart size and mediastinal contours are within normal limits.
Both lungs are clear. The visualized skeletal structures are
unremarkable.
IMPRESSION: No active cardiopulmonary disease.

## 2022-03-08 ENCOUNTER — Other Ambulatory Visit (HOSPITAL_COMMUNITY): Payer: Self-pay

## 2022-06-28 IMAGING — DX DG ANKLE COMPLETE 3+V*L*
3 series · 3 of 3 positions shown · non-contrast
Comparison: Right ankle radiograph dated 09/04/2020.

CLINICAL DATA: 55-year-old female with left ankle pain.

EXAM:
LEFT ANKLE COMPLETE - 3+ VIEW

[dg ankle complete left (1 of 3)]
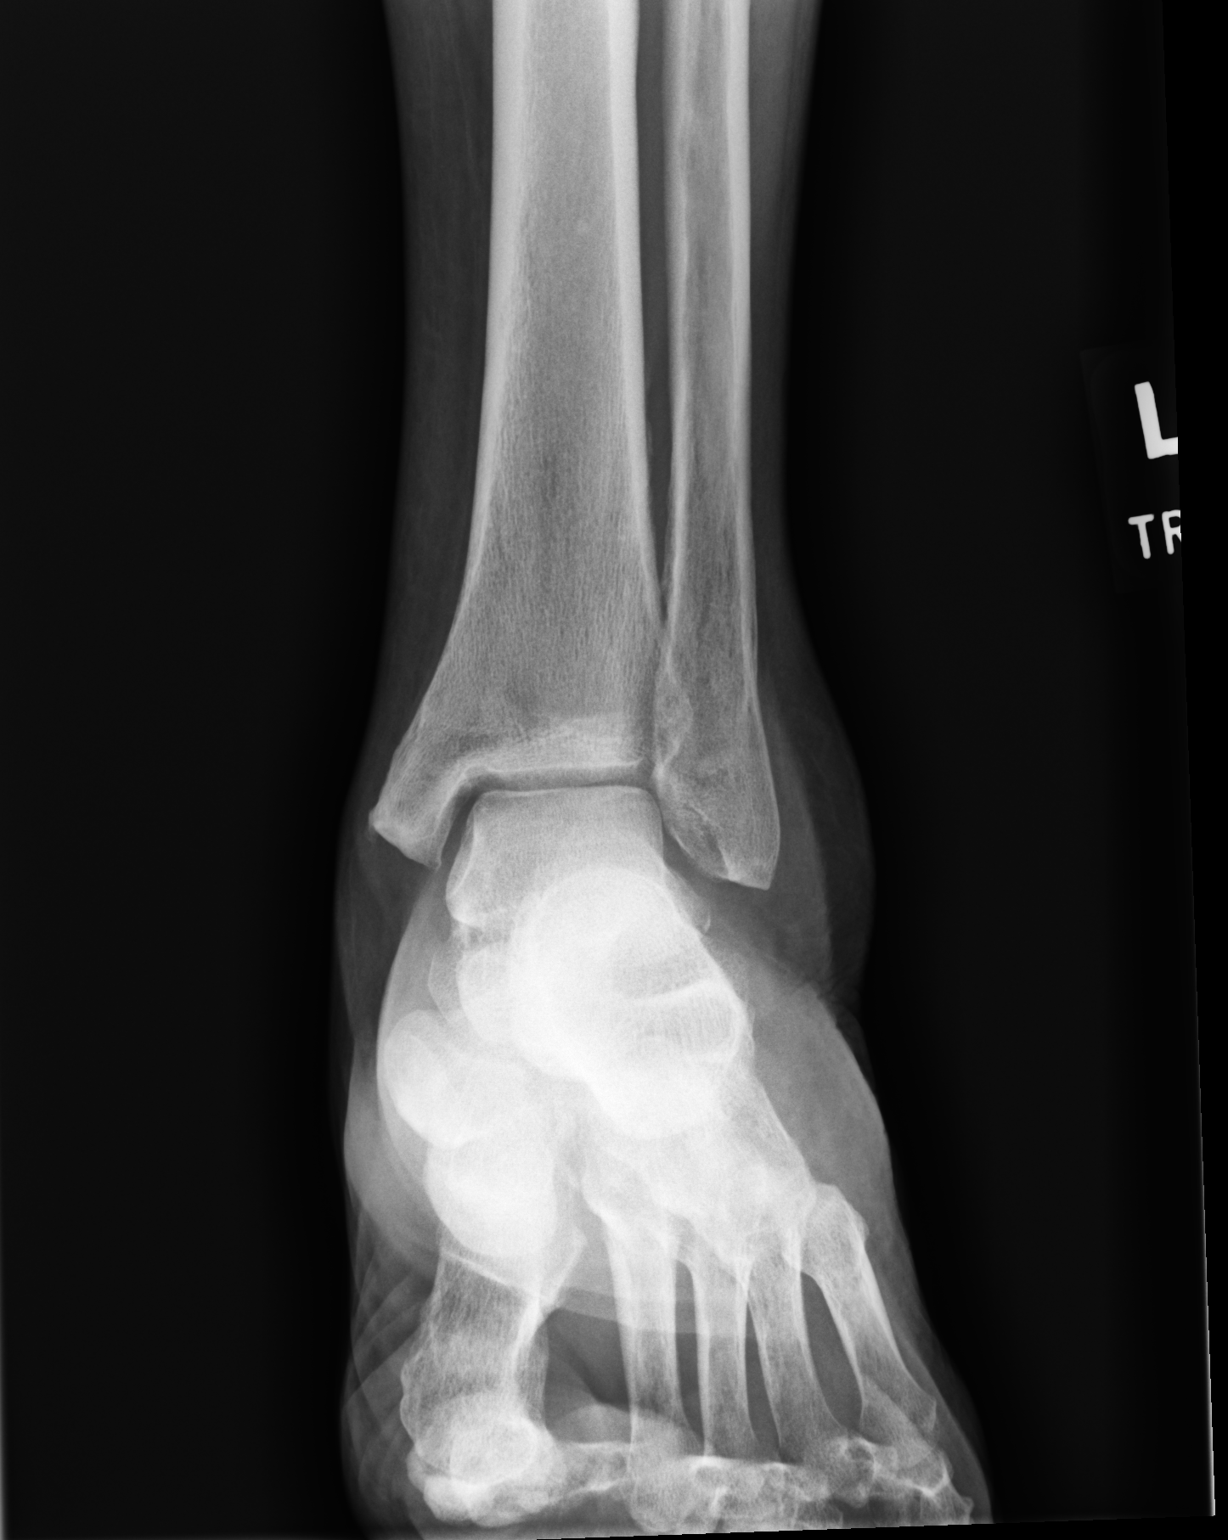

[dg ankle complete left (2 of 3)]
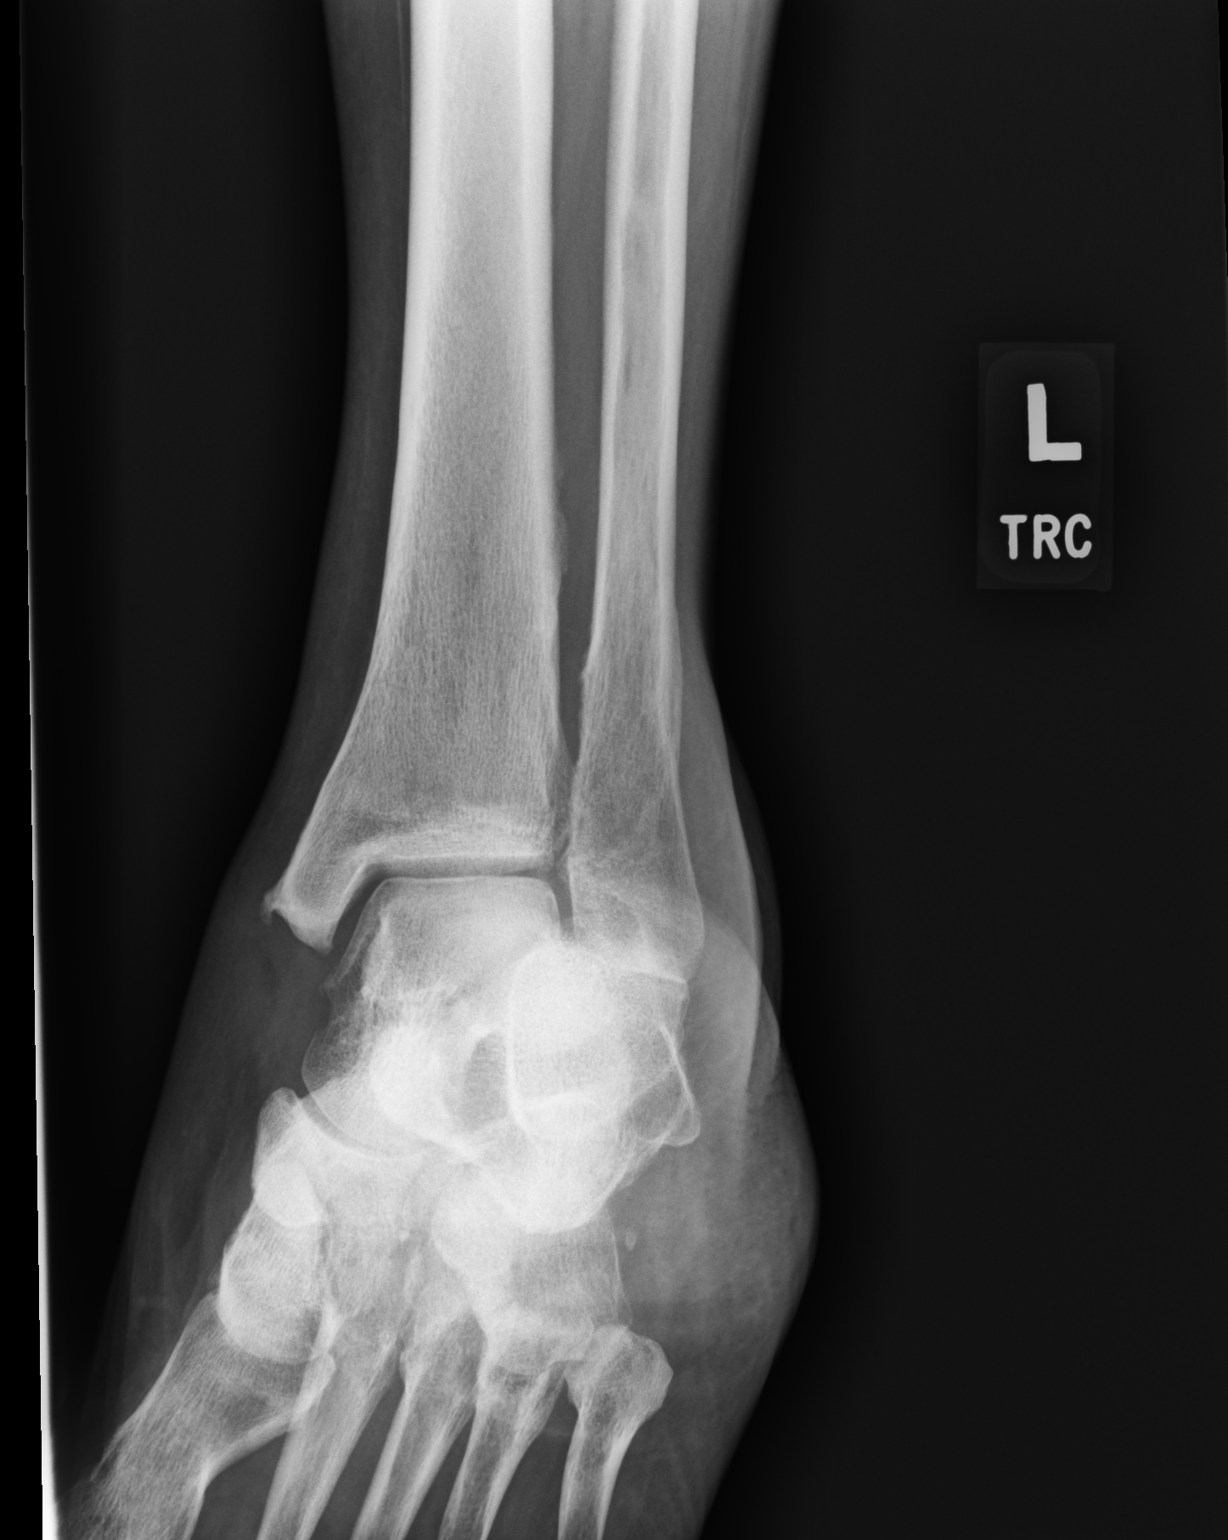

[dg ankle complete left (3 of 3)]
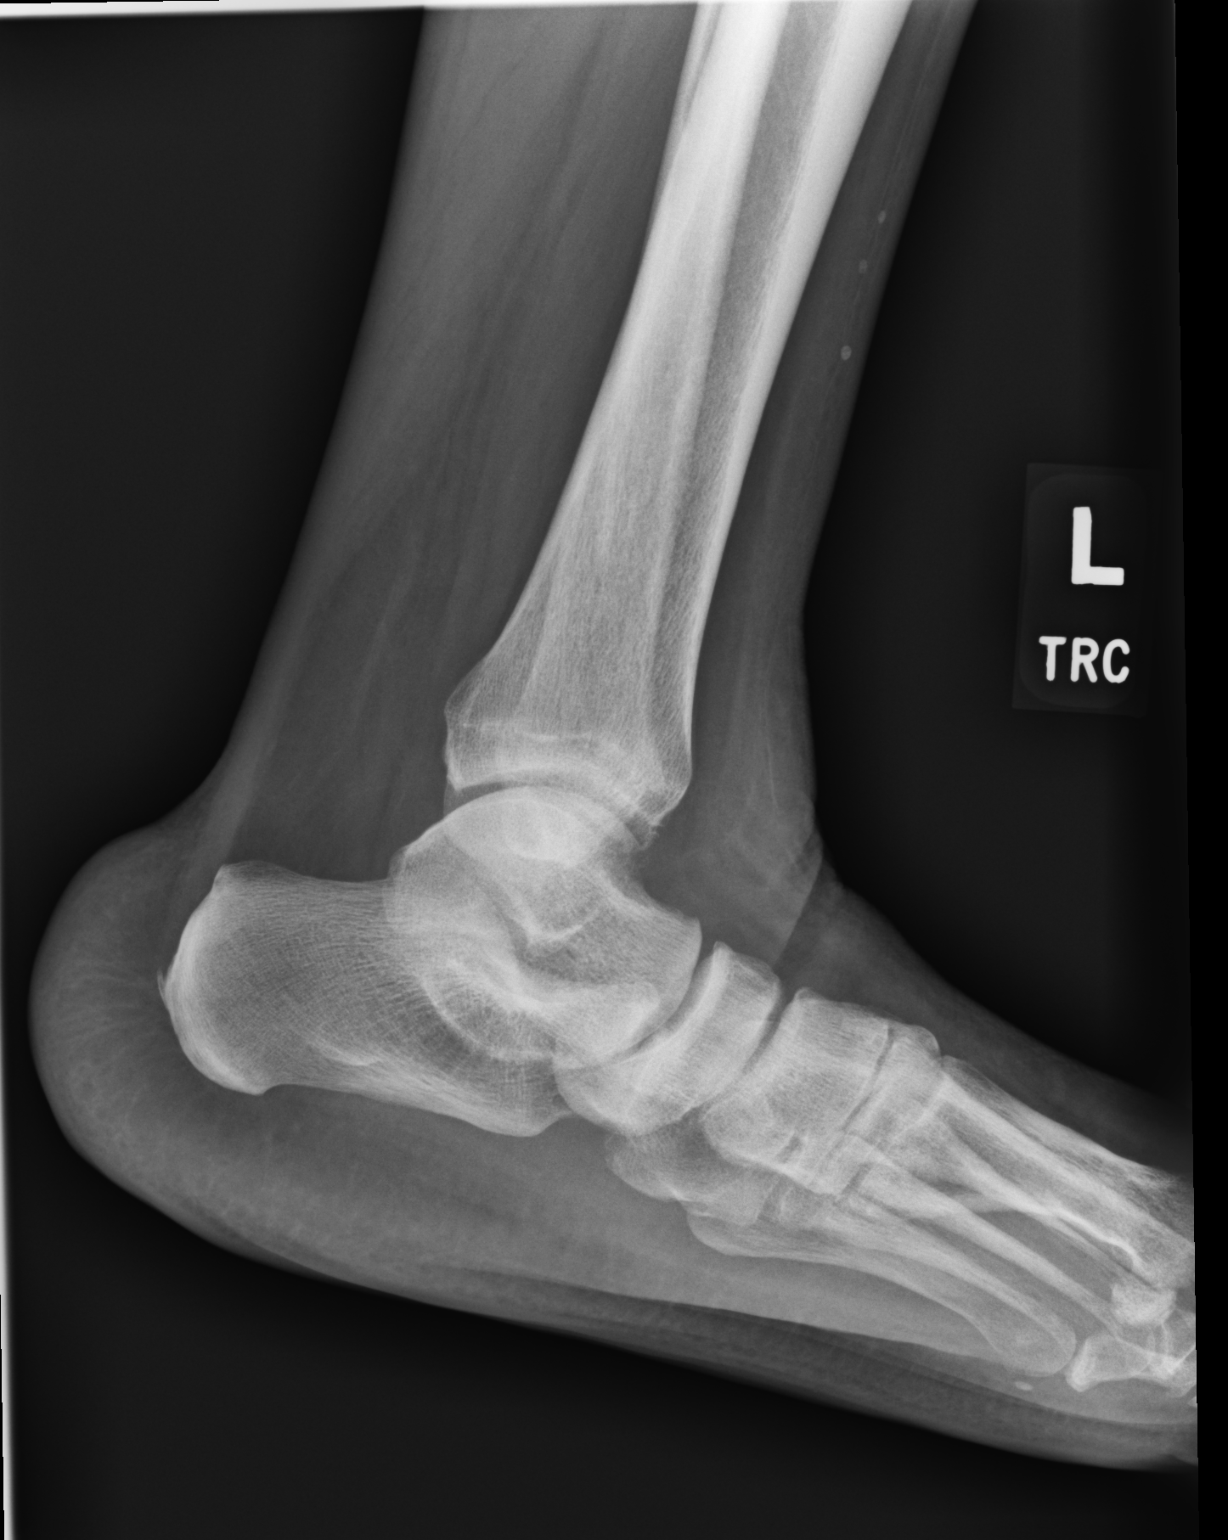

[3 of 3 positions shown; findings below may reference images not displayed]

FINDINGS: No definite acute fracture. Curvilinear density along the lateral
talus is likely projectional. A cortical fracture of the lateral
talus is less likely. Clinical correlation recommended. CT may
provide better evaluation if clinically indicated. There is no
dislocation. The bones are osteopenic. The ankle mortise is intact.
There is pes planus. Mild soft tissue prominence over the lateral
malleolus. No radiopaque foreign object or soft tissue gas.
IMPRESSION: No definite acute fracture. Curvilinear density along the lateral
talus is likely projectional. Clinical correlation recommended.

## 2022-07-18 ENCOUNTER — Encounter: Payer: Self-pay | Admitting: *Deleted

## 2023-03-16 ENCOUNTER — Telehealth: Payer: Self-pay

## 2023-03-16 NOTE — Transitions of Care (Post Inpatient/ED Visit) (Signed)
   03/16/2023  Name: Anne York MRN: [id number] DOB: 02/26/1965  Today's TOC FU Call Status: Today's TOC FU Call Status:: Unsuccessful Call (1st Attempt) Unsuccessful Call (1st Attempt) Date: 03/16/23  Attempted to reach the patient regarding the most recent Inpatient/ED visit.  Follow Up Plan: Additional outreach attempts will be made to reach the patient to complete the Transitions of Care (Post Inpatient/ED visit) call.   Signature Karena Addison, LPN [facility] Nurse Health Advisor Direct Dial [phone]

## 2023-03-20 NOTE — Transitions of Care (Post Inpatient/ED Visit) (Unsigned)
   03/20/2023  Name: Anne York MRN: [id number] DOB: Feb 24, 1965  Today's TOC FU Call Status: Today's TOC FU Call Status:: Unsuccessful Call (2nd Attempt) Unsuccessful Call (1st Attempt) Date: 03/16/23 Unsuccessful Call (2nd Attempt) Date: 03/20/23  Attempted to reach the patient regarding the most recent Inpatient/ED visit.  Follow Up Plan: Additional outreach attempts will be made to reach the patient to complete the Transitions of Care (Post Inpatient/ED visit) call.   Signature Karena Addison, LPN [facility] Nurse Health Advisor Direct Dial [phone]

## 2023-03-21 NOTE — Transitions of Care (Post Inpatient/ED Visit) (Signed)
   03/21/2023  Name: Anne York MRN: [id number] DOB: 1966-02-05  Today's TOC FU Call Status: Today's TOC FU Call Status:: Unsuccessful Call (3rd Attempt) Unsuccessful Call (1st Attempt) Date: 03/16/23 Unsuccessful Call (2nd Attempt) Date: 03/20/23 Unsuccessful Call (3rd Attempt) Date: 03/21/23  Attempted to reach the patient regarding the most recent Inpatient/ED visit.  Follow Up Plan: No further outreach attempts will be made at this time. We have been unable to contact the patient.  Signature Karena Addison, LPN [facility] Nurse Health Advisor Direct Dial [phone]
